# Patient Record
Sex: Female | Born: 1960 | Race: White | Hispanic: No | Marital: Married | Smoking: Never smoker
Health system: Southern US, Community
[De-identification: ages and names within clinical notes are randomized; demographics above are authoritative.]

## PROBLEM LIST (undated history)

## (undated) DIAGNOSIS — IMO0002 Reserved for concepts with insufficient information to code with codable children: Secondary | ICD-10-CM

## (undated) DIAGNOSIS — S2239XA Fracture of one rib, unspecified side, initial encounter for closed fracture: Secondary | ICD-10-CM

## (undated) HISTORY — DX: Fracture of one rib, unspecified side, initial encounter for closed fracture: S22.39XA

## (undated) HISTORY — PX: APPENDECTOMY: SHX54

## (undated) HISTORY — PX: KNEE SURGERY: SHX244

---

## 2007-04-02 ENCOUNTER — Emergency Department (HOSPITAL_COMMUNITY): Admission: EM | Admit: 2007-04-02 | Discharge: 2007-04-02 | Payer: Self-pay | Admitting: Emergency Medicine

## 2008-05-30 ENCOUNTER — Emergency Department (HOSPITAL_BASED_OUTPATIENT_CLINIC_OR_DEPARTMENT_OTHER): Admission: EM | Admit: 2008-05-30 | Discharge: 2008-05-30 | Payer: Self-pay | Admitting: Emergency Medicine

## 2008-07-13 ENCOUNTER — Emergency Department (HOSPITAL_BASED_OUTPATIENT_CLINIC_OR_DEPARTMENT_OTHER): Admission: EM | Admit: 2008-07-13 | Discharge: 2008-07-13 | Payer: Self-pay | Admitting: Emergency Medicine

## 2008-07-21 ENCOUNTER — Emergency Department (HOSPITAL_BASED_OUTPATIENT_CLINIC_OR_DEPARTMENT_OTHER): Admission: EM | Admit: 2008-07-21 | Discharge: 2008-07-21 | Payer: Self-pay | Admitting: Emergency Medicine

## 2008-07-23 ENCOUNTER — Emergency Department (HOSPITAL_BASED_OUTPATIENT_CLINIC_OR_DEPARTMENT_OTHER): Admission: EM | Admit: 2008-07-23 | Discharge: 2008-07-23 | Payer: Self-pay | Admitting: Emergency Medicine

## 2008-09-11 ENCOUNTER — Emergency Department (HOSPITAL_BASED_OUTPATIENT_CLINIC_OR_DEPARTMENT_OTHER): Admission: EM | Admit: 2008-09-11 | Discharge: 2008-09-11 | Payer: Self-pay | Admitting: Emergency Medicine

## 2008-10-13 ENCOUNTER — Emergency Department (HOSPITAL_BASED_OUTPATIENT_CLINIC_OR_DEPARTMENT_OTHER): Admission: EM | Admit: 2008-10-13 | Discharge: 2008-10-13 | Payer: Self-pay | Admitting: Emergency Medicine

## 2008-11-25 ENCOUNTER — Emergency Department (HOSPITAL_BASED_OUTPATIENT_CLINIC_OR_DEPARTMENT_OTHER): Admission: EM | Admit: 2008-11-25 | Discharge: 2008-11-26 | Payer: Self-pay | Admitting: Emergency Medicine

## 2008-12-11 ENCOUNTER — Emergency Department (HOSPITAL_BASED_OUTPATIENT_CLINIC_OR_DEPARTMENT_OTHER): Admission: EM | Admit: 2008-12-11 | Discharge: 2008-12-11 | Payer: Self-pay | Admitting: Emergency Medicine

## 2008-12-21 ENCOUNTER — Emergency Department (HOSPITAL_BASED_OUTPATIENT_CLINIC_OR_DEPARTMENT_OTHER): Admission: EM | Admit: 2008-12-21 | Discharge: 2008-12-21 | Payer: Self-pay | Admitting: Emergency Medicine

## 2009-02-18 ENCOUNTER — Emergency Department (HOSPITAL_BASED_OUTPATIENT_CLINIC_OR_DEPARTMENT_OTHER): Admission: EM | Admit: 2009-02-18 | Discharge: 2009-02-19 | Payer: Self-pay | Admitting: Emergency Medicine

## 2009-02-19 ENCOUNTER — Emergency Department (HOSPITAL_BASED_OUTPATIENT_CLINIC_OR_DEPARTMENT_OTHER): Admission: EM | Admit: 2009-02-19 | Discharge: 2009-02-19 | Payer: Self-pay | Admitting: Emergency Medicine

## 2009-04-21 ENCOUNTER — Emergency Department (HOSPITAL_BASED_OUTPATIENT_CLINIC_OR_DEPARTMENT_OTHER): Admission: EM | Admit: 2009-04-21 | Discharge: 2009-04-21 | Payer: Self-pay | Admitting: Emergency Medicine

## 2009-04-22 ENCOUNTER — Emergency Department (HOSPITAL_BASED_OUTPATIENT_CLINIC_OR_DEPARTMENT_OTHER): Admission: EM | Admit: 2009-04-22 | Discharge: 2009-04-22 | Payer: Self-pay | Admitting: Emergency Medicine

## 2009-07-30 ENCOUNTER — Emergency Department (HOSPITAL_BASED_OUTPATIENT_CLINIC_OR_DEPARTMENT_OTHER): Admission: EM | Admit: 2009-07-30 | Discharge: 2009-07-30 | Payer: Self-pay | Admitting: Emergency Medicine

## 2009-07-30 ENCOUNTER — Ambulatory Visit: Payer: Self-pay | Admitting: Diagnostic Radiology

## 2009-08-23 ENCOUNTER — Emergency Department (HOSPITAL_BASED_OUTPATIENT_CLINIC_OR_DEPARTMENT_OTHER): Admission: EM | Admit: 2009-08-23 | Discharge: 2009-08-23 | Payer: Self-pay | Admitting: Emergency Medicine

## 2009-10-25 ENCOUNTER — Emergency Department (HOSPITAL_BASED_OUTPATIENT_CLINIC_OR_DEPARTMENT_OTHER): Admission: EM | Admit: 2009-10-25 | Discharge: 2009-10-25 | Payer: Self-pay | Admitting: Emergency Medicine

## 2009-11-03 ENCOUNTER — Emergency Department (HOSPITAL_BASED_OUTPATIENT_CLINIC_OR_DEPARTMENT_OTHER): Admission: EM | Admit: 2009-11-03 | Discharge: 2009-11-03 | Payer: Self-pay | Admitting: Emergency Medicine

## 2009-11-04 ENCOUNTER — Emergency Department (HOSPITAL_BASED_OUTPATIENT_CLINIC_OR_DEPARTMENT_OTHER): Admission: EM | Admit: 2009-11-04 | Discharge: 2009-11-04 | Payer: Self-pay | Admitting: Emergency Medicine

## 2009-11-27 ENCOUNTER — Emergency Department (HOSPITAL_BASED_OUTPATIENT_CLINIC_OR_DEPARTMENT_OTHER): Admission: EM | Admit: 2009-11-27 | Discharge: 2009-11-27 | Payer: Self-pay | Admitting: Emergency Medicine

## 2009-12-04 ENCOUNTER — Emergency Department (HOSPITAL_BASED_OUTPATIENT_CLINIC_OR_DEPARTMENT_OTHER): Admission: EM | Admit: 2009-12-04 | Discharge: 2009-12-04 | Payer: Self-pay | Admitting: Emergency Medicine

## 2009-12-12 ENCOUNTER — Emergency Department (HOSPITAL_BASED_OUTPATIENT_CLINIC_OR_DEPARTMENT_OTHER): Admission: EM | Admit: 2009-12-12 | Discharge: 2009-12-12 | Payer: Self-pay | Admitting: Emergency Medicine

## 2009-12-17 DIAGNOSIS — S2249XA Multiple fractures of ribs, unspecified side, initial encounter for closed fracture: Secondary | ICD-10-CM

## 2009-12-17 HISTORY — DX: Multiple fractures of ribs, unspecified side, initial encounter for closed fracture: S22.49XA

## 2010-02-17 ENCOUNTER — Emergency Department (HOSPITAL_BASED_OUTPATIENT_CLINIC_OR_DEPARTMENT_OTHER): Admission: EM | Admit: 2010-02-17 | Discharge: 2010-02-17 | Payer: Self-pay | Admitting: Emergency Medicine

## 2010-05-13 ENCOUNTER — Emergency Department (HOSPITAL_BASED_OUTPATIENT_CLINIC_OR_DEPARTMENT_OTHER): Admission: EM | Admit: 2010-05-13 | Discharge: 2010-05-13 | Payer: Self-pay | Admitting: Emergency Medicine

## 2010-06-30 ENCOUNTER — Emergency Department (HOSPITAL_BASED_OUTPATIENT_CLINIC_OR_DEPARTMENT_OTHER): Admission: EM | Admit: 2010-06-30 | Discharge: 2010-06-30 | Payer: Self-pay | Admitting: Emergency Medicine

## 2010-07-16 ENCOUNTER — Emergency Department (HOSPITAL_BASED_OUTPATIENT_CLINIC_OR_DEPARTMENT_OTHER): Admission: EM | Admit: 2010-07-16 | Discharge: 2010-07-16 | Payer: Self-pay | Admitting: Emergency Medicine

## 2010-08-01 ENCOUNTER — Emergency Department (HOSPITAL_BASED_OUTPATIENT_CLINIC_OR_DEPARTMENT_OTHER): Admission: EM | Admit: 2010-08-01 | Discharge: 2010-08-01 | Payer: Self-pay | Admitting: Emergency Medicine

## 2010-08-31 ENCOUNTER — Emergency Department (HOSPITAL_BASED_OUTPATIENT_CLINIC_OR_DEPARTMENT_OTHER): Admission: EM | Admit: 2010-08-31 | Discharge: 2010-08-31 | Payer: Self-pay | Admitting: Emergency Medicine

## 2010-10-03 ENCOUNTER — Emergency Department (HOSPITAL_BASED_OUTPATIENT_CLINIC_OR_DEPARTMENT_OTHER): Admission: EM | Admit: 2010-10-03 | Discharge: 2010-10-03 | Payer: Self-pay | Admitting: Emergency Medicine

## 2010-11-15 ENCOUNTER — Emergency Department (HOSPITAL_BASED_OUTPATIENT_CLINIC_OR_DEPARTMENT_OTHER)
Admission: EM | Admit: 2010-11-15 | Discharge: 2010-11-16 | Payer: Self-pay | Source: Home / Self Care | Admitting: Emergency Medicine

## 2010-11-15 ENCOUNTER — Emergency Department (HOSPITAL_BASED_OUTPATIENT_CLINIC_OR_DEPARTMENT_OTHER)
Admission: EM | Admit: 2010-11-15 | Discharge: 2010-11-15 | Payer: Self-pay | Source: Home / Self Care | Admitting: Emergency Medicine

## 2010-12-05 ENCOUNTER — Emergency Department (HOSPITAL_BASED_OUTPATIENT_CLINIC_OR_DEPARTMENT_OTHER)
Admission: EM | Admit: 2010-12-05 | Discharge: 2010-12-05 | Payer: Self-pay | Source: Home / Self Care | Admitting: Emergency Medicine

## 2010-12-06 ENCOUNTER — Emergency Department (HOSPITAL_BASED_OUTPATIENT_CLINIC_OR_DEPARTMENT_OTHER)
Admission: EM | Admit: 2010-12-06 | Discharge: 2010-12-06 | Payer: Self-pay | Source: Home / Self Care | Admitting: Emergency Medicine

## 2010-12-06 ENCOUNTER — Emergency Department (HOSPITAL_BASED_OUTPATIENT_CLINIC_OR_DEPARTMENT_OTHER): Admission: EM | Admit: 2010-12-06 | Payer: Self-pay | Source: Home / Self Care | Admitting: Emergency Medicine

## 2010-12-19 ENCOUNTER — Emergency Department (HOSPITAL_BASED_OUTPATIENT_CLINIC_OR_DEPARTMENT_OTHER)
Admission: EM | Admit: 2010-12-19 | Discharge: 2010-12-20 | Payer: Self-pay | Source: Home / Self Care | Admitting: Emergency Medicine

## 2010-12-27 ENCOUNTER — Emergency Department (HOSPITAL_BASED_OUTPATIENT_CLINIC_OR_DEPARTMENT_OTHER)
Admission: EM | Admit: 2010-12-27 | Discharge: 2010-12-27 | Payer: Self-pay | Source: Home / Self Care | Admitting: Emergency Medicine

## 2011-01-09 ENCOUNTER — Emergency Department (HOSPITAL_BASED_OUTPATIENT_CLINIC_OR_DEPARTMENT_OTHER)
Admission: EM | Admit: 2011-01-09 | Discharge: 2011-01-10 | Payer: Self-pay | Source: Home / Self Care | Admitting: Emergency Medicine

## 2011-01-25 ENCOUNTER — Emergency Department (HOSPITAL_BASED_OUTPATIENT_CLINIC_OR_DEPARTMENT_OTHER)
Admission: EM | Admit: 2011-01-25 | Discharge: 2011-01-25 | Disposition: A | Discharge status: Home / Self Care | Payer: PRIVATE HEALTH INSURANCE | Attending: Emergency Medicine | Admitting: Emergency Medicine

## 2011-01-25 DIAGNOSIS — R51 Headache: Secondary | ICD-10-CM | POA: Insufficient documentation

## 2011-01-26 ENCOUNTER — Emergency Department (HOSPITAL_BASED_OUTPATIENT_CLINIC_OR_DEPARTMENT_OTHER)
Admission: EM | Admit: 2011-01-26 | Discharge: 2011-01-26 | Disposition: A | Discharge status: Home / Self Care | Payer: PRIVATE HEALTH INSURANCE | Attending: Emergency Medicine | Admitting: Emergency Medicine

## 2011-01-26 DIAGNOSIS — G43909 Migraine, unspecified, not intractable, without status migrainosus: Secondary | ICD-10-CM | POA: Insufficient documentation

## 2011-01-26 DIAGNOSIS — Z79899 Other long term (current) drug therapy: Secondary | ICD-10-CM | POA: Insufficient documentation

## 2011-02-09 ENCOUNTER — Emergency Department (HOSPITAL_BASED_OUTPATIENT_CLINIC_OR_DEPARTMENT_OTHER)
Admission: EM | Admit: 2011-02-09 | Discharge: 2011-02-09 | Disposition: A | Discharge status: Home / Self Care | Payer: PRIVATE HEALTH INSURANCE | Attending: Emergency Medicine | Admitting: Emergency Medicine

## 2011-02-09 DIAGNOSIS — Z79899 Other long term (current) drug therapy: Secondary | ICD-10-CM | POA: Insufficient documentation

## 2011-02-09 DIAGNOSIS — R110 Nausea: Secondary | ICD-10-CM | POA: Insufficient documentation

## 2011-02-09 DIAGNOSIS — G43909 Migraine, unspecified, not intractable, without status migrainosus: Secondary | ICD-10-CM | POA: Insufficient documentation

## 2011-02-09 DIAGNOSIS — H53149 Visual discomfort, unspecified: Secondary | ICD-10-CM | POA: Insufficient documentation

## 2011-03-23 LAB — B. BURGDORFI ANTIBODIES: B burgdorferi Ab IgG+IgM: 0.36 {ISR}

## 2011-08-15 ENCOUNTER — Encounter: Payer: Self-pay | Admitting: *Deleted

## 2011-08-15 ENCOUNTER — Emergency Department (HOSPITAL_BASED_OUTPATIENT_CLINIC_OR_DEPARTMENT_OTHER)
Admission: EM | Admit: 2011-08-15 | Discharge: 2011-08-15 | Disposition: A | Discharge status: Home / Self Care | Payer: PRIVATE HEALTH INSURANCE | Attending: Emergency Medicine | Admitting: Emergency Medicine

## 2011-08-15 DIAGNOSIS — R51 Headache: Secondary | ICD-10-CM | POA: Insufficient documentation

## 2011-08-15 HISTORY — DX: Reserved for concepts with insufficient information to code with codable children: IMO0002

## 2011-08-15 MED ORDER — HYDROMORPHONE HCL 1 MG/ML IJ SOLN
1.0000 mg | Freq: Once | INTRAMUSCULAR | Status: AC
  Administered 2011-08-15: 1 mg via INTRAVENOUS

## 2011-08-15 MED ORDER — LORAZEPAM 2 MG/ML IJ SOLN
1.0000 mg | Freq: Once | INTRAMUSCULAR | Status: AC
  Administered 2011-08-15: 1 mg via INTRAVENOUS
  Filled 2011-08-15: qty 1

## 2011-08-15 MED ORDER — METOCLOPRAMIDE HCL 5 MG/ML IJ SOLN
10.0000 mg | Freq: Once | INTRAMUSCULAR | Status: AC
  Administered 2011-08-15: 10 mg via INTRAVENOUS
  Filled 2011-08-15: qty 2

## 2011-08-15 MED ORDER — METHYLPREDNISOLONE SODIUM SUCC 125 MG IJ SOLR
125.0000 mg | Freq: Once | INTRAMUSCULAR | Status: AC
Start: 2011-08-15 — End: 2011-08-15
  Administered 2011-08-15: 125 mg via INTRAVENOUS
  Filled 2011-08-15: qty 2

## 2011-08-15 MED ORDER — DIPHENHYDRAMINE HCL 50 MG/ML IJ SOLN
25.0000 mg | Freq: Once | INTRAMUSCULAR | Status: AC
  Administered 2011-08-15: 25 mg via INTRAVENOUS
  Filled 2011-08-15: qty 1

## 2011-08-15 MED ORDER — ONDANSETRON HCL 4 MG/2ML IJ SOLN
4.0000 mg | Freq: Once | INTRAMUSCULAR | Status: AC
  Administered 2011-08-15: 4 mg via INTRAVENOUS
  Filled 2011-08-15: qty 2

## 2011-08-15 MED ORDER — SODIUM CHLORIDE 0.9 % IV BOLUS (SEPSIS)
1000.0000 mL | Freq: Once | INTRAVENOUS | Status: AC
  Administered 2011-08-15: 1000 mL via INTRAVENOUS

## 2011-08-15 MED ORDER — HYDROMORPHONE HCL 1 MG/ML IJ SOLN
INTRAMUSCULAR | Status: AC
  Administered 2011-08-15: 1 mg via INTRAVENOUS
  Filled 2011-08-15: qty 1

## 2011-08-15 MED ORDER — KETOROLAC TROMETHAMINE 30 MG/ML IJ SOLN
30.0000 mg | Freq: Once | INTRAMUSCULAR | Status: AC
  Administered 2011-08-15: 30 mg via INTRAVENOUS
  Filled 2011-08-15: qty 1

## 2011-08-15 NOTE — ED Notes (Signed)
Pt c/o migraine x 2 days.  ?

## 2011-08-15 NOTE — ED Notes (Signed)
Patient is resting comfortably. 

## 2011-08-15 NOTE — ED Provider Notes (Signed)
History     CSN: [id number] Arrival date & time: 08/15/2011  2:16 PM  Chief Complaint  Patient presents with  . Migraine   Patient is a 50 y.o. female presenting with migraine. The history is provided by the patient. No language interpreter was used.  Migraine This is a new problem. The current episode started yesterday. The problem occurs constantly. The problem has been gradually worsening. Associated symptoms include headaches, myalgias, nausea and vomiting. Pertinent negatives include no fatigue, fever, rash, sore throat, swollen glands, vertigo, visual change or weakness. The symptoms are aggravated by nothing. She has tried rest for the symptoms. The treatment provided no relief.  Pt complains of a migrane headache.  Pt reports no relief with usual medications.  Pt reports she has been vomitting today.  Past Medical History  Diagnosis Date  . Migraine   . Spinal cord injuries     Past Surgical History  Procedure Date  . Joint replacement   . Appendectomy     History reviewed. No pertinent family history.  History  Substance Use Topics  . Smoking status: Never Smoker   . Smokeless tobacco: Not on file  . Alcohol Use: No    OB History    Grav Para Term Preterm Abortions TAB SAB Ect Mult Living                  Review of Systems  Constitutional: Negative for fever and fatigue.  HENT: Negative for sore throat.   Gastrointestinal: Positive for nausea and vomiting.  Musculoskeletal: Positive for myalgias.  Skin: Negative for rash.  Neurological: Positive for headaches. Negative for vertigo and weakness.  All other systems reviewed and are negative.    Physical Exam  BP 112/68  Pulse 102  Temp 97.6 F (36.4 C)  Resp 18  Wt 120 lb (54.432 kg)  Physical Exam  Nursing note and vitals reviewed. Constitutional: She is oriented to person, place, and time. She appears well-developed and well-nourished.  HENT:  Head: Normocephalic and atraumatic.  Eyes:  Conjunctivae and EOM are normal. Pupils are equal, round, and reactive to light.  Neck: Normal range of motion. Neck supple.  Cardiovascular: Normal rate.   Pulmonary/Chest: Effort normal and breath sounds normal.  Abdominal: Soft. Bowel sounds are normal.  Musculoskeletal: Normal range of motion.  Neurological: She is alert and oriented to person, place, and time. She has normal reflexes.  Skin: Skin is warm and dry.  Psychiatric: She has a normal mood and affect.    ED Course  Procedures  MDM Pt given migrane cocktail,  Pt complains of continued vomitting,  Pt given Iv fluids x 1 liter and ativan and zofran.      Langston Masker, [place] 08/15/11 1913  Langston Masker, [place] 08/15/11 [phone]

## 2011-08-15 NOTE — ED Notes (Signed)
Family at bedside.reports pain 4/10

## 2011-08-15 NOTE — ED Provider Notes (Signed)
Medical screening examination/treatment/procedure(s) were performed by non-physician practitioner and as supervising physician I was immediately available for consultation/collaboration.   Maddelynn Moosman, MD 08/15/11 2353 

## 2011-08-15 NOTE — ED Notes (Signed)
Reports headache is 8/10

## 2011-08-15 NOTE — ED Notes (Signed)
Pt acuity changed from 4 to 3 based on treatment plan.  

## 2012-01-19 ENCOUNTER — Other Ambulatory Visit: Payer: Self-pay

## 2012-01-19 ENCOUNTER — Emergency Department (INDEPENDENT_AMBULATORY_CARE_PROVIDER_SITE_OTHER): Payer: PRIVATE HEALTH INSURANCE

## 2012-01-19 ENCOUNTER — Encounter (HOSPITAL_BASED_OUTPATIENT_CLINIC_OR_DEPARTMENT_OTHER): Payer: Self-pay | Admitting: Emergency Medicine

## 2012-01-19 ENCOUNTER — Emergency Department (HOSPITAL_BASED_OUTPATIENT_CLINIC_OR_DEPARTMENT_OTHER)
Admission: EM | Admit: 2012-01-19 | Discharge: 2012-01-19 | Disposition: A | Discharge status: Home / Self Care | Payer: PRIVATE HEALTH INSURANCE | Attending: Emergency Medicine | Admitting: Emergency Medicine

## 2012-01-19 DIAGNOSIS — S20219A Contusion of unspecified front wall of thorax, initial encounter: Secondary | ICD-10-CM | POA: Insufficient documentation

## 2012-01-19 DIAGNOSIS — W19XXXA Unspecified fall, initial encounter: Secondary | ICD-10-CM

## 2012-01-19 DIAGNOSIS — R079 Chest pain, unspecified: Secondary | ICD-10-CM

## 2012-01-19 DIAGNOSIS — S298XXA Other specified injuries of thorax, initial encounter: Secondary | ICD-10-CM

## 2012-01-19 DIAGNOSIS — M899 Disorder of bone, unspecified: Secondary | ICD-10-CM

## 2012-01-19 DIAGNOSIS — T07XXXA Unspecified multiple injuries, initial encounter: Secondary | ICD-10-CM | POA: Diagnosis not present

## 2012-01-19 DIAGNOSIS — S60219A Contusion of unspecified wrist, initial encounter: Secondary | ICD-10-CM | POA: Insufficient documentation

## 2012-01-19 DIAGNOSIS — S6990XA Unspecified injury of unspecified wrist, hand and finger(s), initial encounter: Secondary | ICD-10-CM | POA: Diagnosis not present

## 2012-01-19 DIAGNOSIS — M25539 Pain in unspecified wrist: Secondary | ICD-10-CM

## 2012-01-19 DIAGNOSIS — S59919A Unspecified injury of unspecified forearm, initial encounter: Secondary | ICD-10-CM

## 2012-01-19 DIAGNOSIS — S59909A Unspecified injury of unspecified elbow, initial encounter: Secondary | ICD-10-CM | POA: Diagnosis not present

## 2012-01-19 DIAGNOSIS — W108XXA Fall (on) (from) other stairs and steps, initial encounter: Secondary | ICD-10-CM | POA: Insufficient documentation

## 2012-01-19 MED ORDER — DIAZEPAM 5 MG PO TABS: 5.0000 mg | ORAL_TABLET | Freq: Every day | ORAL | Status: AC

## 2012-01-19 MED ORDER — IBUPROFEN 800 MG PO TABS: 800.0000 mg | ORAL_TABLET | Freq: Three times a day (TID) | ORAL | Status: DC

## 2012-01-19 NOTE — ED Notes (Signed)
rx x 2 given for valium and ibuprofen- ride present

## 2012-01-19 NOTE — ED Notes (Signed)
Pt reports she fell down 3 stairs one hour ago- c/o left side chest pain worse with inspiration- hx of broken ribs on same side

## 2012-01-19 NOTE — ED Notes (Signed)
Patient transported to X-ray 

## 2012-01-19 NOTE — ED Provider Notes (Signed)
History     CSN: [id number]  Arrival date & time 01/19/12  1719   First MD Initiated Contact with Patient 01/19/12 1730      Chief Complaint  Patient presents with  . Chest Pain    (Consider location/radiation/quality/duration/timing/severity/associated sxs/prior treatment) HPI  Past Medical History  Diagnosis Date  . Migraine   . Spinal cord injuries     Past Surgical History  Procedure Date  . Appendectomy   . Knee surgery     No family history on file.  History  Substance Use Topics  . Smoking status: Never Smoker   . Smokeless tobacco: Never Used  . Alcohol Use: No    OB History    Grav Para Term Preterm Abortions TAB SAB Ect Mult Living                  Review of Systems  Allergies  Codeine and Tetanus toxoids  Home Medications   Current Outpatient Rx  Name Route Sig Dispense Refill  . DICLOFENAC POTASSIUM 50 MG PO PACK Oral Take 50 mg by mouth. Once on the onset of headache/migraine    . SUMATRIPTAN SUCCINATE 6 MG/0.5ML [place] SOLN Subcutaneous Inject 6 mg into the skin every 2 (two) hours as needed. For migraine    . TOPIRAMATE 100 MG PO TABS Oral Take 200 mg by mouth 2 (two) times daily.        BP 124/53  Pulse 95  Temp(Src) 97.4 F (36.3 C) (Oral)  Resp 12  SpO2 100%  Physical Exam  ED Course  Procedures (including critical care time)  Labs Reviewed - No data to display Dg Ribs Unilateral W/chest Left  01/19/2012  *RADIOLOGY REPORT*  Clinical Data: Fall.  Left chest injury and rib pain.  LEFT RIBS AND CHEST - 3+ VIEW  Comparison: 12/27/2010  Findings: No evidence of acute left-sided rib fractures.  Old fracture deformities are seen involving the left anterior seventh, eighth, and ninth ribs.  No evidence of hemothorax or pneumothorax.  Both lungs are clear. Heart size is normal.  No evidence of mediastinal widening or tracheal deviation.  Incidental note is made of bilateral nipple shadows on the PA projection.  IMPRESSION: No acute findings.   Original Report Authenticated By: Danae Orleans, M.D.   Dg Wrist Complete Left  01/19/2012  *RADIOLOGY REPORT*  Clinical Data: Fall.  Wrist injury and pain.  LEFT WRIST - COMPLETE 3+ VIEW  Comparison: None.  Findings: Generalized osteopenia is noted.  No evidence of fracture or dislocation.  No evidence of arthropathy or other bone lesions. Soft tissues are unremarkable.  IMPRESSION:  1.  No acute findings. 2.  Osteopenia.  Original Report Authenticated By: Danae Orleans, M.D.     No diagnosis found.  XR all reviewed by me  MDM  This generally well female presents following a fall down 3 steps just prior to arrival.  On exam she is in no distress though she has appreciable discomfort in her left chest and left wrist.  Radiographic studies are negative.  The patient we discharged with additional pain medications in stable condition.        Gerhard Munch, MD 01/19/12 [phone]

## 2012-01-19 NOTE — ED Provider Notes (Signed)
History   This chart was scribed for Gerhard Munch, MD by Melba Coon. The patient was seen in room MH04/MH04 and the patient's care was started at 5:45PM.    CSN: [id number]  Arrival date & time 01/19/12  1719   First MD Initiated Contact with Patient 01/19/12 1730      Chief Complaint  Patient presents with  . Chest Pain    (Consider location/radiation/quality/duration/timing/severity/associated sxs/prior treatment) HPI Tasha Cruz is a 51 y.o. female who presents to the Emergency Department complaining of constant, diffuse, sharp, moderate to severe left-sided chest pain pertaining to a fall down 3 stairs onto a cement surface with an onset about an hour and a half ago. Pt also has left shoulder, left wrist, and right knee pain. Pt fell but thought her injuries were not serious. About 10 min later, pt was was sitting down when she noticed increased pressure in her chest while breathing. Pt rates the severity of the  CP 6/10. Pt states that 2.5 years ago, she punctured her left lung and fractured ribs in the same area as the present chest pain. Nausea present. No LOC, vomit, or diarrhea. Pt has a Hx of migraines in which she takes Topamax 2x day, Amtrex shots as needed, and Botox every 3 months. Pt is allergic to OxyContin, codeine, and tetanus toxoids.   Past Medical History  Diagnosis Date  . Migraine   . Spinal cord injuries     Past Surgical History  Procedure Date  . Appendectomy   . Knee surgery     No family history on file.  History  Substance Use Topics  . Smoking status: Never Smoker   . Smokeless tobacco: Never Used  . Alcohol Use: No    OB History    Grav Para Term Preterm Abortions TAB SAB Ect Mult Living                  Review of Systems 10 Systems reviewed and are negative for acute change except as noted in the HPI.  Allergies  Codeine and Tetanus toxoids  Home Medications   Current Outpatient Rx  Name Route Sig Dispense Refill  .  DICLOFENAC POTASSIUM 50 MG PO PACK Oral Take 50 mg by mouth. Once on the onset of headache/migraine    . SUMATRIPTAN SUCCINATE 6 MG/0.5ML [place] SOLN Subcutaneous Inject 6 mg into the skin every 2 (two) hours as needed. For migraine    . TOPIRAMATE 100 MG PO TABS Oral Take 200 mg by mouth 2 (two) times daily.        There were no vitals taken for this visit.  Physical Exam  Constitutional: She is oriented to person, place, and time. She appears well-developed and well-nourished.  HENT:  Head: Normocephalic and atraumatic.  Eyes: Conjunctivae and EOM are normal. Pupils are equal, round, and reactive to light. No scleral icterus.  Neck: Normal range of motion. Neck supple. No thyromegaly present.  Cardiovascular: Normal rate, regular rhythm and normal heart sounds.  Exam reveals no gallop and no friction rub.   No murmur heard. Pulmonary/Chest: Effort normal and breath sounds normal. No stridor. She has no wheezes. She has no rales. She exhibits no tenderness.  Abdominal: Soft. Bowel sounds are normal. She exhibits no distension. There is no tenderness. There is no rebound.  Musculoskeletal: Normal range of motion. She exhibits tenderness (5th metacarpal proximally). She exhibits no edema.       no true wrist deformity  Lymphadenopathy:  She has no cervical adenopathy.  Neurological: She is alert and oriented to person, place, and time. Coordination normal.  Skin: Skin is warm. Abrasion (right knee) noted. No rash noted. No erythema.  Psychiatric: She has a normal mood and affect. Her behavior is normal.    ED Course  Procedures (including critical care time)  DIAGNOSTIC STUDIES: Oxygen Saturation is 100% on room air, normal by my interpretation.    COORDINATION OF CARE:     Labs Reviewed - No data to display No results found.   No diagnosis found.    MDM  Please see the duplicate chart for the remainder of this visit information.      Gerhard Munch, MD 02/05/12  [phone]

## 2012-01-19 NOTE — ED Notes (Signed)
[place] MD at bedside. 

## 2012-01-29 ENCOUNTER — Encounter: Payer: Self-pay | Admitting: Family

## 2012-01-29 ENCOUNTER — Ambulatory Visit (HOSPITAL_BASED_OUTPATIENT_CLINIC_OR_DEPARTMENT_OTHER)
Admission: RE | Admit: 2012-01-29 | Discharge: 2012-01-29 | Disposition: A | Discharge status: Home / Self Care | Payer: PRIVATE HEALTH INSURANCE | Source: Ambulatory Visit | Attending: Family | Admitting: Family

## 2012-01-29 ENCOUNTER — Telehealth: Payer: Self-pay | Admitting: Family

## 2012-01-29 ENCOUNTER — Ambulatory Visit (INDEPENDENT_AMBULATORY_CARE_PROVIDER_SITE_OTHER): Payer: PRIVATE HEALTH INSURANCE | Admitting: Family

## 2012-01-29 DIAGNOSIS — R079 Chest pain, unspecified: Secondary | ICD-10-CM

## 2012-01-29 DIAGNOSIS — W19XXXA Unspecified fall, initial encounter: Secondary | ICD-10-CM | POA: Diagnosis not present

## 2012-01-29 DIAGNOSIS — S20219A Contusion of unspecified front wall of thorax, initial encounter: Secondary | ICD-10-CM

## 2012-01-29 DIAGNOSIS — G43909 Migraine, unspecified, not intractable, without status migrainosus: Secondary | ICD-10-CM | POA: Diagnosis not present
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
## 2012-01-29 NOTE — Telephone Encounter (Signed)
Please let pt know that her CT of her chest is normal.  No rib fractures or lung abnormalities are noted.  I think that her pain is coming from severe bruising from her fall.  I recommend that she use tylenol for pain.  Pain should continue to improve over the next few weeks.

## 2012-01-29 NOTE — Assessment & Plan Note (Signed)
Will obtain CT chest to further evaluate ribs, lungs, heart.  Continue prn cambia.

## 2012-01-29 NOTE — Telephone Encounter (Signed)
Notified pt. 

## 2012-01-29 NOTE — Patient Instructions (Signed)
Please complete your CT on the first floor- we will call you with your results. Follow up at your convenience for a fasting physical.

## 2012-01-30 DIAGNOSIS — G43719 Chronic migraine without aura, intractable, without status migrainosus: Secondary | ICD-10-CM | POA: Insufficient documentation

## 2012-01-30 NOTE — Assessment & Plan Note (Signed)
She is following with neurology and reports good success with the use of botox injections.

## 2012-02-25 ENCOUNTER — Emergency Department (HOSPITAL_BASED_OUTPATIENT_CLINIC_OR_DEPARTMENT_OTHER)
Admission: EM | Admit: 2012-02-25 | Discharge: 2012-02-25 | Disposition: A | Discharge status: Home / Self Care | Payer: PRIVATE HEALTH INSURANCE | Attending: Emergency Medicine | Admitting: Emergency Medicine

## 2012-02-25 ENCOUNTER — Encounter (HOSPITAL_BASED_OUTPATIENT_CLINIC_OR_DEPARTMENT_OTHER): Payer: Self-pay | Admitting: Family Medicine

## 2012-02-25 DIAGNOSIS — G43909 Migraine, unspecified, not intractable, without status migrainosus: Secondary | ICD-10-CM

## 2012-02-25 DIAGNOSIS — R51 Headache: Secondary | ICD-10-CM | POA: Diagnosis not present

## 2012-02-25 MED ORDER — ONDANSETRON HCL 4 MG/2ML IJ SOLN
4.0000 mg | Freq: Once | INTRAMUSCULAR | Status: AC
  Administered 2012-02-25: 4 mg via INTRAVENOUS
  Filled 2012-02-25: qty 2

## 2012-02-25 MED ORDER — DEXAMETHASONE SODIUM PHOSPHATE 10 MG/ML IJ SOLN
10.0000 mg | Freq: Once | INTRAMUSCULAR | Status: AC
  Administered 2012-02-25: 10 mg via INTRAVENOUS
  Filled 2012-02-25 (×2): qty 1

## 2012-02-25 MED ORDER — HYDROMORPHONE HCL PF 1 MG/ML IJ SOLN
1.0000 mg | Freq: Once | INTRAMUSCULAR | Status: AC
  Administered 2012-02-25: 1 mg via INTRAVENOUS
  Filled 2012-02-25: qty 1

## 2012-02-25 MED ORDER — SODIUM CHLORIDE 0.9 % IV BOLUS (SEPSIS)
1000.0000 mL | Freq: Once | INTRAVENOUS | Status: AC
  Administered 2012-02-25: 1000 mL via INTRAVENOUS

## 2012-02-25 MED ORDER — SODIUM CHLORIDE 0.9 % IV SOLN
INTRAVENOUS | Status: DC
  Administered 2012-02-25: 09:00:00 via INTRAVENOUS

## 2012-02-25 MED ORDER — VALPROATE SODIUM 500 MG/5ML IV SOLN
500.0000 mg | Freq: Once | INTRAVENOUS | Status: AC
  Administered 2012-02-25: 500 mg via INTRAVENOUS
  Filled 2012-02-25: qty 5

## 2012-02-25 MED ORDER — PROMETHAZINE HCL 25 MG/ML IJ SOLN
12.5000 mg | Freq: Once | INTRAMUSCULAR | Status: AC
  Administered 2012-02-25: 12.5 mg via INTRAVENOUS
  Filled 2012-02-25: qty 1

## 2012-02-25 MED ORDER — DIPHENHYDRAMINE HCL 50 MG/ML IJ SOLN
25.0000 mg | Freq: Once | INTRAMUSCULAR | Status: AC
  Administered 2012-02-25: 25 mg via INTRAVENOUS
  Filled 2012-02-25: qty 1

## 2012-02-25 NOTE — Discharge Instructions (Signed)
Migraine Headache A migraine is very bad pain on one or both sides of your head. The cause of a migraine is not always known. A migraine can be triggered or caused by different things, such as:  Alcohol.   Smoking.   Stress.   Periods (menstruation) in women.   Aged cheeses.   Foods or drinks that contain nitrates, glutamate, aspartame, or tyramine.   Lack of sleep.   Chocolate.   Caffeine.   Hunger.   Medicines, such as nitroglycerine (used to treat chest pain), birth control pills, estrogen, and some blood pressure medicines.  HOME CARE  Many medicines can help migraine pain or keep migraines from coming back. Your doctor can help you decide on a medicine or treatment program.   If you or your child gets a migraine, it may help to lie down in a dark, quiet room.   Keep a headache journal. This may help find out what is causing the headaches. For example, write down:   What you eat and drink.   How much sleep you get.   Any change to your diet or medicines.  GET HELP RIGHT AWAY IF:   The medicine does not work.   The pain begins again.   The neck is stiff.   You have trouble seeing.   The muscles are weak or you lose muscle control.   You have new symptoms.   You lose your balance.   You have trouble walking.   You feel faint or pass out.  MAKE SURE YOU:   Understand these instructions.   Will watch this condition.   Will get help right away if you are not doing well or get worse.  Document Released: 09/11/2008 Document Revised: 11/22/2011 Document Reviewed: 08/08/2009 [facility] Patient Information 2012 [place], [place].  Followup with your neurologist for further care of your migraine as scheduled. Return for new or worse symptoms.

## 2012-02-25 NOTE — ED Notes (Signed)
MD at bedside. 

## 2012-02-25 NOTE — ED Notes (Addendum)
Pt c/o migraine since Saturday with nausea and photophobia. Pt reports h/o same. Pt sts she took imitrex injection, tegretol and other meds with some relief.

## 2012-02-25 NOTE — ED Provider Notes (Signed)
History     CSN: [id number]  Arrival date & time 02/25/12  6578   First MD Initiated Contact with Patient 02/25/12 [phone]      Chief Complaint  Patient presents with  . Migraine    (Consider location/radiation/quality/duration/timing/severity/associated sxs/prior treatment) Patient is a 51 y.o. female presenting with migraine. The history is provided by the patient and the spouse.  Migraine This is a new problem. The current episode started 2 days ago. The problem occurs constantly. The problem has been gradually worsening. Associated symptoms include headaches. Pertinent negatives include no chest pain, no abdominal pain and no shortness of breath. Exacerbated by: By light. The symptoms are relieved by nothing.   Patient with onset of migraine 2 days ago typical for her migraines left-sided symptoms associated with nausea no vomiting photophobia no neuro deficits or focal findings. Pain is 10 out of 10.   Past Medical History  Diagnosis Date  . Migraine   . Spinal cord injuries   . Rib fractures 2011    Past Surgical History  Procedure Date  . Appendectomy   . Knee surgery     Family History  Problem Relation Age of Onset  . Hypertension Mother     deceased at 68  . Hepatitis Father     ? autoimmune  . Pancreatitis Sister   . Pernicious anemia Sister   . Ovarian cancer Sister     age 27    History  Substance Use Topics  . Smoking status: Never Smoker   . Smokeless tobacco: Never Used  . Alcohol Use: No    OB History    Grav Para Term Preterm Abortions TAB SAB Ect Mult Living                  Review of Systems  Constitutional: Negative for fever and chills.  HENT: Negative for congestion, neck pain and neck stiffness.   Eyes: Positive for photophobia. Negative for redness and visual disturbance.  Respiratory: Negative for cough and shortness of breath.   Cardiovascular: Negative for chest pain.  Gastrointestinal: Positive for nausea. Negative for  vomiting, abdominal pain and diarrhea.  Genitourinary: Negative for dysuria.  Musculoskeletal: Negative for back pain.  Skin: Negative for rash.  Neurological: Positive for headaches. Negative for dizziness, seizures, facial asymmetry, speech difficulty, weakness and numbness.  Hematological: Does not bruise/bleed easily.    Allergies  Codeine and Tetanus toxoids  Home Medications   Current Outpatient Rx  Name Route Sig Dispense Refill  . BOTOX 200 UNITS [place] SOLR  For migraines    . DICLOFENAC POTASSIUM 50 MG PO PACK Oral Take 50 mg by mouth. Once on the onset of headache/migraine    . SUMATRIPTAN SUCCINATE 6 MG/0.5ML [place] SOLN Subcutaneous Inject 6 mg into the skin every 2 (two) hours as needed. For migraine    . TOPIRAMATE 200 MG PO TABS Oral Take 200 mg by mouth 2 (two) times daily.      BP 108/66  Pulse 78  Temp(Src) 97.6 F (36.4 C) (Oral)  Resp 20  Ht 5\' 4"  (1.626 m)  Wt 127 lb (57.607 kg)  BMI 21.80 kg/m2  SpO2 98%  Physical Exam  Nursing note and vitals reviewed. Constitutional: She is oriented to person, place, and time. She appears well-developed and well-nourished.  HENT:  Head: Normocephalic and atraumatic.  Mouth/Throat: Oropharynx is clear and moist.  Eyes: Conjunctivae and EOM are normal. Pupils are equal, round, and reactive to light.  Neck: Normal range  of motion. Neck supple.  Cardiovascular: Normal rate, regular rhythm and normal heart sounds.   No murmur heard. Pulmonary/Chest: Effort normal and breath sounds normal.  Abdominal: Soft. Bowel sounds are normal. There is no tenderness.  Musculoskeletal: Normal range of motion.  Neurological: She is alert and oriented to person, place, and time. No cranial nerve deficit. She exhibits normal muscle tone. Coordination normal.  Skin: Skin is warm. No rash noted.    ED Course  Procedures (including critical care time)  Labs Reviewed - No data to display No results found.   1. Migraines       MDM    Migraine now improved significant he patient feeling much better okay for discharge and followup with her neurologist. Migraine typical for her headaches. She has a diagnosis of migraines.   Patient treated with migraine cocktail to include IV Decadron Benadryl and Phenergan patient initially refused to Decadron because she doesn't like the way it makes her feel a few days later but does not have any true allergy did not get relief with the Benadryl and Phenergan so eventually try with her neurologist suggested which was Depakote IV. That did not help either. " And going with the Decadron which she did take and also gave her 1 mg of Dilaudid and had significant relief over a period of an hour of headache. No fever no neck stiffness no concern for meningitis head bleed.        [name], MD 02/25/12 [phone]

## 2012-04-29 DIAGNOSIS — G43719 Chronic migraine without aura, intractable, without status migrainosus: Secondary | ICD-10-CM | POA: Diagnosis not present

## 2012-08-07 DIAGNOSIS — G43719 Chronic migraine without aura, intractable, without status migrainosus: Secondary | ICD-10-CM | POA: Diagnosis not present

## 2012-11-07 DIAGNOSIS — G43719 Chronic migraine without aura, intractable, without status migrainosus: Secondary | ICD-10-CM | POA: Diagnosis not present

## 2012-11-22 ENCOUNTER — Emergency Department (HOSPITAL_BASED_OUTPATIENT_CLINIC_OR_DEPARTMENT_OTHER)
Admission: EM | Admit: 2012-11-22 | Discharge: 2012-11-22 | Disposition: A | Discharge status: Home / Self Care | Payer: PRIVATE HEALTH INSURANCE | Attending: Emergency Medicine | Admitting: Emergency Medicine

## 2012-11-22 ENCOUNTER — Encounter (HOSPITAL_BASED_OUTPATIENT_CLINIC_OR_DEPARTMENT_OTHER): Payer: Self-pay | Admitting: *Deleted

## 2012-11-22 DIAGNOSIS — IMO0002 Reserved for concepts with insufficient information to code with codable children: Secondary | ICD-10-CM | POA: Insufficient documentation

## 2012-11-22 DIAGNOSIS — T1510XA Foreign body in conjunctival sac, unspecified eye, initial encounter: Secondary | ICD-10-CM | POA: Diagnosis not present

## 2012-11-22 DIAGNOSIS — Z8781 Personal history of (healed) traumatic fracture: Secondary | ICD-10-CM | POA: Insufficient documentation

## 2012-11-22 DIAGNOSIS — G43909 Migraine, unspecified, not intractable, without status migrainosus: Secondary | ICD-10-CM | POA: Diagnosis not present

## 2012-11-22 DIAGNOSIS — Z87828 Personal history of other (healed) physical injury and trauma: Secondary | ICD-10-CM | POA: Diagnosis not present

## 2012-11-22 DIAGNOSIS — Y929 Unspecified place or not applicable: Secondary | ICD-10-CM | POA: Insufficient documentation

## 2012-11-22 DIAGNOSIS — T1590XA Foreign body on external eye, part unspecified, unspecified eye, initial encounter: Secondary | ICD-10-CM

## 2012-11-22 DIAGNOSIS — Y9389 Activity, other specified: Secondary | ICD-10-CM | POA: Insufficient documentation

## 2012-11-22 NOTE — ED Notes (Signed)
Pt states she was trying some new contacts and one got misplaced in her right eye. Contact removed at triage. Pt instructed to stay for evaluation of eye and vision.

## 2012-11-22 NOTE — ED Provider Notes (Signed)
History     CSN: [id number]  Arrival date & time 11/22/12  4098   First MD Initiated Contact with Patient 11/22/12 2146      Chief Complaint  Patient presents with  . Foreign Body in Eye    (Consider location/radiation/quality/duration/timing/severity/associated sxs/prior treatment) Patient is a 51 y.o. female presenting with eye problem. The history is provided by the patient. No language interpreter was used.  Eye Problem  This is a new problem. The current episode started 3 to 5 hours ago. The problem occurs constantly. The problem has not changed since onset.The right eye is affected.Injury mechanism: Pt reports she put in a new contact and it got stuck under her eyelid  The pain is moderate. There is no history of trauma to the eye. She wears contacts. She has tried nothing for the symptoms.   Rn was able to find contact and remove at triage Past Medical History  Diagnosis Date  . Migraine   . Spinal cord injuries   . Rib fractures 2011    Past Surgical History  Procedure Date  . Appendectomy   . Knee surgery     Family History  Problem Relation Age of Onset  . Hypertension Mother     deceased at 4  . Hepatitis Father     ? autoimmune  . Pancreatitis Sister   . Pernicious anemia Sister   . Ovarian cancer Sister     age 33    History  Substance Use Topics  . Smoking status: Never Smoker   . Smokeless tobacco: Never Used  . Alcohol Use: No    OB History    Grav Para Term Preterm Abortions TAB SAB Ect Mult Living                  Review of Systems  Eyes: Positive for pain.  All other systems reviewed and are negative.    Allergies  Codeine and Tetanus toxoids  Home Medications   Current Outpatient Rx  Name  Route  Sig  Dispense  Refill  . BOTOX 200 UNITS IJ SOLR      For migraines         . DICLOFENAC POTASSIUM 50 MG PO PACK   Oral   Take 50 mg by mouth. Once on the onset of headache/migraine         . SUMATRIPTAN SUCCINATE 6  MG/0.5ML [place] SOLN   Subcutaneous   Inject 6 mg into the skin every 2 (two) hours as needed. For migraine         . TOPIRAMATE 200 MG PO TABS   Oral   Take 200 mg by mouth 2 (two) times daily.           BP 127/63  Pulse 84  Temp 98.1 F (36.7 C) (Oral)  Resp 20  Ht 5\' 5"  (1.651 m)  Wt 120 lb (54.432 kg)  BMI 19.97 kg/m2  SpO2 100%  Physical Exam  Nursing note and vitals reviewed. Constitutional: She is oriented to person, place, and time. She appears well-developed and well-nourished.  HENT:  Head: Normocephalic.  Eyes: Conjunctivae normal and EOM are normal. Pupils are equal, round, and reactive to light.       No evidence of eye injury  Neurological: She is alert and oriented to person, place, and time.  Skin: Skin is warm.  Psychiatric: She has a normal mood and affect.    ED Course  Procedures (including critical care time)  Labs Reviewed -  No data to display No results found.   1. Foreign body, eye       MDM  Pt advised to not use contacts for 24 hours.  Pt advised to discuss new contacts with her MD        Elson Areas, [place] 11/23/12 [phone]

## 2012-11-23 NOTE — ED Provider Notes (Signed)
Medical screening examination/treatment/procedure(s) were performed by non-physician practitioner and as supervising physician I was immediately available for consultation/collaboration.  Doug Sou, MD 11/23/12 0110

## 2013-05-03 ENCOUNTER — Other Ambulatory Visit: Payer: Self-pay | Admitting: Neurology

## 2013-05-13 ENCOUNTER — Ambulatory Visit (INDEPENDENT_AMBULATORY_CARE_PROVIDER_SITE_OTHER): Payer: PRIVATE HEALTH INSURANCE | Admitting: Neurology

## 2013-05-13 ENCOUNTER — Encounter: Payer: Self-pay | Admitting: Neurology

## 2013-05-13 VITALS — Ht 64.0 in | Wt 129.0 lb

## 2013-05-13 DIAGNOSIS — G43909 Migraine, unspecified, not intractable, without status migrainosus: Secondary | ICD-10-CM

## 2013-05-13 DIAGNOSIS — G43719 Chronic migraine without aura, intractable, without status migrainosus: Secondary | ICD-10-CM | POA: Diagnosis not present

## 2013-05-13 MED ORDER — ONABOTULINUMTOXINA 100 UNITS IJ SOLR: 150.0000 [IU] | Freq: Once | INTRAMUSCULAR | Status: DC
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
## 2013-05-17 ENCOUNTER — Other Ambulatory Visit: Payer: Self-pay | Admitting: Neurology

## 2013-06-16 ENCOUNTER — Encounter: Payer: Self-pay | Admitting: Family

## 2013-06-16 ENCOUNTER — Ambulatory Visit (HOSPITAL_BASED_OUTPATIENT_CLINIC_OR_DEPARTMENT_OTHER)
Admission: RE | Admit: 2013-06-16 | Discharge: 2013-06-16 | Disposition: A | Discharge status: Home / Self Care | Payer: PRIVATE HEALTH INSURANCE | Source: Ambulatory Visit | Attending: Family | Admitting: Family

## 2013-06-16 ENCOUNTER — Ambulatory Visit (INDEPENDENT_AMBULATORY_CARE_PROVIDER_SITE_OTHER): Payer: PRIVATE HEALTH INSURANCE | Admitting: Family

## 2013-06-16 VITALS — BP 110/80 | HR 88 | Temp 97.8°F | Resp 18 | Wt 125.0 lb

## 2013-06-16 DIAGNOSIS — R059 Cough, unspecified: Secondary | ICD-10-CM | POA: Insufficient documentation

## 2013-06-16 DIAGNOSIS — R05 Cough: Secondary | ICD-10-CM | POA: Insufficient documentation

## 2013-06-16 DIAGNOSIS — R233 Spontaneous ecchymoses: Secondary | ICD-10-CM

## 2013-06-16 DIAGNOSIS — M2550 Pain in unspecified joint: Secondary | ICD-10-CM | POA: Diagnosis not present

## 2013-06-16 LAB — CBC WITH DIFFERENTIAL/PLATELET
Eosinophils Relative: 3 % (ref 0–5)
HCT: 40.1 % (ref 36.0–46.0)
Hemoglobin: 13.7 g/dL (ref 12.0–15.0)
Lymphocytes Relative: 35 % (ref 12–46)
MCV: 90.3 fL (ref 78.0–100.0)
Monocytes Absolute: 0.5 10*3/uL (ref 0.1–1.0)
Monocytes Relative: 9 % (ref 3–12)
Neutro Abs: 2.9 10*3/uL (ref 1.7–7.7)
RDW: 13 % (ref 11.5–15.5)
WBC: 5.3 10*3/uL (ref 4.0–10.5)

## 2013-06-16 LAB — SEDIMENTATION RATE: Sed Rate: 4 mm/hr (ref 0–22)

## 2013-06-16 LAB — URIC ACID: Uric Acid, Serum: 3.5 mg/dL (ref 2.4–7.0)

## 2013-06-16 NOTE — Assessment & Plan Note (Signed)
She reports that she has been tested for lyme disease several times and that she has tested negative.  Will check ANA, RA, ESR and a uric acid. Refer to rheumatology for further evaluation.

## 2013-06-16 NOTE — Assessment & Plan Note (Signed)
Will obtain CBC to evaluate platelets/H/H.

## 2013-06-16 NOTE — Patient Instructions (Addendum)
Please complete your lab work prior to leaving. You will be contacted about your referral to rheumatology.  Please let us know if you have not heard back within 1 week about your referral. Follow up in 6 weeks.

## 2013-06-16 NOTE — Assessment & Plan Note (Signed)
Obtain CXR 

## 2013-06-16 NOTE — Progress Notes (Signed)
Subjective:    Patient ID: Tasha Cruz, female    DOB: 1961-10-12, 52 y.o.   MRN: [id number]  HPI  [name] is a 52 yr old female who presents today to discuss a "broken blood vessels" under her arms.   Also reports concern re: joint pain.  Reports that her toes and feet are hurting to the point that she has had to buy new shoes and has had trouble walking at times.  Reports + joint pain- especially in the hands. Reports that she had negative work up 1 year ago (reports neg RA with ortho) and that they recommended that she have rheumatology consult. She did not follow through with her rheumatology consult.  She reports that she has been tested several times or lyme disease and reports negative testing "and I even took the pills."  Reports that she has a "funny cough" and some left anterior chest wall tenderness.  Hands hurt,   Back pain.  Reports that back pain is at baseline but additional   Dad died of crohns disease.    Review of Systems See HPI  Past Medical History  Diagnosis Date  . Migraine   . Spinal cord injuries   . Rib fractures 2011    History   Social History  . Marital Status: Married    Spouse Name: Acupuncturist    Number of Children: 3  . Years of Education: college   Occupational History  . Homemaker    Social History Main Topics  . Smoking status: Never Smoker   . Smokeless tobacco: Never Used  . Alcohol Use: No  . Drug Use: No  . Sexually Active: Yes    Birth Control/ Protection: Post-menopausal   Other Topics Concern  . 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   Right handed    Past Surgical History  Procedure Laterality Date  . Appendectomy    . Knee surgery      Family History  Problem Relation Age of Onset  . Hypertension Mother     deceased at 43   . Hepatitis Father     ? autoimmune  . Pancreatitis Sister   . Pernicious anemia Sister   . Ovarian cancer Sister     age 25    Allergies  Allergen Reactions  . Codeine Anaphylaxis  . Tetanus Toxoids Swelling    Current Outpatient Prescriptions on File Prior to Visit  Medication Sig Dispense Refill  . BOTOX 200 UNITS SOLR For migraines      . Diclofenac Potassium (CAMBIA) 50 MG PACK Take 50 mg by mouth. Once on the onset of headache/migraine      . SUMAtriptan (IMITREX) 6 MG/0.5ML SOLN injection USE AS NEEDED FOR MIGRAINE  7 mL  3  . topiramate (TOPAMAX) 200 MG tablet TAKE 1 TABLET BY MOUTH TWICE A DAY  60 tablet  6   Current Facility-Administered Medications on File Prior to Visit  Medication Dose Route Frequency Provider Last Rate Last Dose  . botulinum toxin Type A (BOTOX) injection 150 Units  150 Units Intramuscular Once Levert Feinstein, MD        BP 110/80  Pulse 88  Temp(Src) 97.8 F (36.6 C) (Oral)  Resp 18  Wt 125 lb (56.7 kg)  BMI 21.45 kg/m2  SpO2 99%       Objective:   Physical Exam  Constitutional: She is oriented to person, place, and time. She appears well-developed and well-nourished. No distress.  Cardiovascular: Normal rate and regular rhythm.   No murmur heard. Pulmonary/Chest: Effort normal and breath sounds normal. No respiratory distress. She has no wheezes. She has no rales. She exhibits no tenderness.  Musculoskeletal:  Prominent joints noted in fingers without overt swelling or erythema.    Neurological: She is alert and oriented to person, place, and time.  Skin:  Fine petechiae noted on bilateral arms/anterior chest.  Psychiatric: She has a normal mood and affect. Her behavior is normal. Judgment and thought content normal.          Assessment & Plan:

## 2013-06-17 ENCOUNTER — Other Ambulatory Visit: Payer: Self-pay

## 2013-06-17 LAB — ANA: Anti Nuclear Antibody(ANA): NEGATIVE

## 2013-06-17 MED ORDER — TOPIRAMATE 200 MG PO TABS: 200.0000 mg | ORAL_TABLET | Freq: Two times a day (BID) | ORAL | Status: DC

## 2013-06-17 NOTE — Telephone Encounter (Signed)
Pharmacy requested 90 day Rx  

## 2013-06-27 ENCOUNTER — Emergency Department (HOSPITAL_BASED_OUTPATIENT_CLINIC_OR_DEPARTMENT_OTHER)
Admission: EM | Admit: 2013-06-27 | Discharge: 2013-06-27 | Disposition: A | Discharge status: Home / Self Care | Payer: PRIVATE HEALTH INSURANCE | Attending: Emergency Medicine | Admitting: Emergency Medicine

## 2013-06-27 ENCOUNTER — Encounter (HOSPITAL_BASED_OUTPATIENT_CLINIC_OR_DEPARTMENT_OTHER): Payer: Self-pay | Admitting: *Deleted

## 2013-06-27 DIAGNOSIS — M5430 Sciatica, unspecified side: Secondary | ICD-10-CM | POA: Insufficient documentation

## 2013-06-27 DIAGNOSIS — Y929 Unspecified place or not applicable: Secondary | ICD-10-CM | POA: Insufficient documentation

## 2013-06-27 DIAGNOSIS — IMO0002 Reserved for concepts with insufficient information to code with codable children: Secondary | ICD-10-CM | POA: Diagnosis not present

## 2013-06-27 DIAGNOSIS — Z79899 Other long term (current) drug therapy: Secondary | ICD-10-CM | POA: Insufficient documentation

## 2013-06-27 DIAGNOSIS — G43909 Migraine, unspecified, not intractable, without status migrainosus: Secondary | ICD-10-CM | POA: Diagnosis not present

## 2013-06-27 DIAGNOSIS — W108XXA Fall (on) (from) other stairs and steps, initial encounter: Secondary | ICD-10-CM | POA: Insufficient documentation

## 2013-06-27 DIAGNOSIS — Z8739 Personal history of other diseases of the musculoskeletal system and connective tissue: Secondary | ICD-10-CM | POA: Insufficient documentation

## 2013-06-27 DIAGNOSIS — Z8781 Personal history of (healed) traumatic fracture: Secondary | ICD-10-CM | POA: Diagnosis not present

## 2013-06-27 DIAGNOSIS — Y9389 Activity, other specified: Secondary | ICD-10-CM | POA: Insufficient documentation

## 2013-06-27 DIAGNOSIS — M5431 Sciatica, right side: Secondary | ICD-10-CM

## 2013-06-27 MED ORDER — PREDNISONE 20 MG PO TABS: ORAL_TABLET | ORAL | Status: DC

## 2013-06-27 MED ORDER — ONDANSETRON 8 MG PO TBDP
8.0000 mg | ORAL_TABLET | Freq: Once | ORAL | Status: AC
  Administered 2013-06-27: 8 mg via ORAL
  Filled 2013-06-27: qty 1

## 2013-06-27 MED ORDER — METHOCARBAMOL 500 MG PO TABS: 500.0000 mg | ORAL_TABLET | Freq: Two times a day (BID) | ORAL | Status: DC

## 2013-06-27 MED ORDER — PROMETHAZINE HCL 25 MG PO TABS: 25.0000 mg | ORAL_TABLET | Freq: Four times a day (QID) | ORAL | Status: DC | PRN

## 2013-06-27 MED ORDER — OXYCODONE-ACETAMINOPHEN 5-325 MG PO TABS
2.0000 | ORAL_TABLET | Freq: Once | ORAL | Status: AC
  Administered 2013-06-27: 2 via ORAL
  Filled 2013-06-27 (×2): qty 2

## 2013-06-27 MED ORDER — OXYCODONE-ACETAMINOPHEN 5-325 MG PO TABS: 2.0000 | ORAL_TABLET | ORAL | Status: DC | PRN

## 2013-06-27 NOTE — ED Provider Notes (Signed)
History    CSN: [id number] Arrival date & time 06/27/13  1405  First MD Initiated Contact with Patient 06/27/13 1529     Chief Complaint  Patient presents with  . Fall   (Consider location/radiation/quality/duration/timing/severity/associated sxs/prior Treatment) HPI Comments: Is a 52 year old female presents today with right-sided back pain. It is a sharp, burning pain that radiates both upper back and down her right leg. She has history of herniated L1 through L6 20 years ago which she manages without issue. Last night she fell going down the stairs. She was ambulatory after the fall. She has not taken anything for her pain. Her pain is worse with movement and ambulation. Nothing makes the pain better. No bowel or bladder incontinence, IV drug abuse, history of cancer.  Patient is a 52 y.o. female presenting with fall. The history is provided by the patient. No language interpreter was used.  Fall Pertinent negatives include no abdominal pain, chills, fever, nausea or vomiting.   Past Medical History  Diagnosis Date  . Migraine   . Spinal cord injuries   . Rib fractures 2011   Past Surgical History  Procedure Laterality Date  . Appendectomy    . Knee surgery     Family History  Problem Relation Age of Onset  . Hypertension Mother     deceased at 58  . Hepatitis Father     ? autoimmune  . Pancreatitis Sister   . Pernicious anemia Sister   . Ovarian cancer Sister     age 27   History  Substance Use Topics  . Smoking status: Never Smoker   . Smokeless tobacco: Never Used  . Alcohol Use: No   OB History   Grav Para Term Preterm Abortions TAB SAB Ect Mult Living                 Review of Systems  Constitutional: Negative for fever and chills.  Gastrointestinal: Negative for nausea, vomiting and abdominal pain.  Genitourinary: Negative for difficulty urinating.  Musculoskeletal: Positive for back pain and gait problem.  All other systems reviewed and are  negative.    Allergies  Codeine and Tetanus toxoids  Home Medications   Current Outpatient Rx  Name  Route  Sig  Dispense  Refill  . BOTOX 200 UNITS SOLR      For migraines         . Diclofenac Potassium (CAMBIA) 50 MG PACK   Oral   Take 50 mg by mouth. Once on the onset of headache/migraine         . SUMAtriptan (IMITREX) 6 MG/0.5ML SOLN injection      USE AS NEEDED FOR MIGRAINE   7 mL   3   . topiramate (TOPAMAX) 200 MG tablet   Oral   Take 1 tablet (200 mg total) by mouth 2 (two) times daily.   180 tablet   1    BP 113/72  Pulse 86  Temp(Src) 98.1 F (36.7 C) (Oral)  Resp 20  Ht 5\' 5"  (1.651 m)  Wt 123 lb (55.792 kg)  BMI 20.47 kg/m2  SpO2 100% Physical Exam  Nursing note and vitals reviewed. Constitutional: She is oriented to person, place, and time. She appears well-developed and well-nourished. No distress.  HENT:  Head: Normocephalic and atraumatic.  Right Ear: External ear normal.  Left Ear: External ear normal.  Nose: Nose normal.  Mouth/Throat: Oropharynx is clear and moist.  Eyes: Conjunctivae are normal.  Neck: Normal range of motion.  Cardiovascular: Normal rate, regular rhythm, normal heart sounds and intact distal pulses.   Pulmonary/Chest: Effort normal and breath sounds normal. No stridor. No respiratory distress. She has no wheezes. She has no rales.  Abdominal: Soft. She exhibits no distension. There is no tenderness.  Musculoskeletal:       Lumbar back: She exhibits decreased range of motion, tenderness and pain. She exhibits no deformity.  No deformity, step offs  Tender to palpation over SI joint on right  Neurological: She is alert and oriented to person, place, and time. She has normal strength.  Skin: Skin is warm and dry. She is not diaphoretic. No erythema.  Psychiatric: She has a normal mood and affect. Her behavior is normal.    ED Course  Procedures (including critical care time) Labs Reviewed - No data to display No  results found. 1. Sciatica, right     MDM  Patient with back pain.  No neurological deficits and normal neuro exam.  Patient can walk but states is painful.  No loss of bowel or bladder control.  No concern for cauda equina.  No fever, night sweats, weight loss, h/o cancer, IVDU.  RICE protocol and pain medicine indicated and discussed with patient.    Mora Bellman, PA-C 06/30/13 0901  Mora Bellman, PA-C 07/08/13 [phone]

## 2013-06-27 NOTE — ED Notes (Signed)
Pt states she missed a couple of steps last p.m. and fell injuring her back. C/O pain to same.

## 2013-07-02 ENCOUNTER — Ambulatory Visit (INDEPENDENT_AMBULATORY_CARE_PROVIDER_SITE_OTHER): Payer: PRIVATE HEALTH INSURANCE | Admitting: Family Medicine

## 2013-07-02 ENCOUNTER — Encounter: Payer: Self-pay | Admitting: Family Medicine

## 2013-07-02 VITALS — BP 108/70 | HR 83 | Temp 97.9°F | Wt 129.6 lb

## 2013-07-02 DIAGNOSIS — M5126 Other intervertebral disc displacement, lumbar region: Secondary | ICD-10-CM

## 2013-07-02 DIAGNOSIS — H3560 Retinal hemorrhage, unspecified eye: Secondary | ICD-10-CM

## 2013-07-02 DIAGNOSIS — Z0100 Encounter for examination of eyes and vision without abnormal findings: Secondary | ICD-10-CM | POA: Diagnosis not present

## 2013-07-02 DIAGNOSIS — H1131 Conjunctival hemorrhage, right eye: Secondary | ICD-10-CM | POA: Insufficient documentation

## 2013-07-02 MED ORDER — CYCLOBENZAPRINE HCL 10 MG PO TABS: 10.0000 mg | ORAL_TABLET | Freq: Three times a day (TID) | ORAL | Status: DC | PRN
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
## 2013-07-02 NOTE — Patient Instructions (Addendum)
We'll call you with your ortho appt Take the Prednisone as directed- take w/ food HEAT! Use the flexeril as directed- will cause drowsiness The burst blood vessel may look slightly worse before it improves but it will resolve w/ time If you develop pain, visual changes, or other concerns- please call or go to the eye doctor Sherri Rad in there!

## 2013-07-02 NOTE — Progress Notes (Signed)
  Subjective:    Patient ID: Tasha Cruz, female    DOB: August 10, 1961, 52 y.o.   MRN: [id number]  HPI S/p fall- occurred Friday 7/11.  Has hx of bulging L1-5.  'every now and then when it flares, my legs give out and I fall'.  Fell down the steps this time.  Was unable to get out of bed due to pain.  Went to ER.  Was given prednisone, pain meds, muscle relaxer.  Still in pain.  This afternoon noted broken blood vessel in R eye.  Has rheum visit pending for petechiae.  Today also noted numbness of L 4th and 5th fingers.  Denies visual changes, N/V, current HA (although has hx of migraines), eye pain.   Review of Systems For ROS see HPI     Objective:   Physical Exam  Vitals reviewed. Constitutional: She appears well-developed and well-nourished.  Eyes: EOM are normal. Pupils are equal, round, and reactive to light.  R subconjuctival hemorrhage laterally  Musculoskeletal: She exhibits tenderness (over L spine).  Neurological:  Antalgic gait + SLR on R  Psychiatric:  Very anxious          Assessment & Plan:

## 2013-07-02 NOTE — Assessment & Plan Note (Signed)
New.  Likely due to trauma of recent fall.  Vision WNL.  No eye pain, no photophobia.  Reviewed dx and benign nature- including self limited course.  Reviewed supportive care and red flags that should prompt return.  Pt expressed understanding and is in agreement w/ plan.

## 2013-07-06 DIAGNOSIS — M47817 Spondylosis without myelopathy or radiculopathy, lumbosacral region: Secondary | ICD-10-CM | POA: Diagnosis not present

## 2013-07-08 NOTE — ED Provider Notes (Signed)
Medical screening examination/treatment/procedure(s) were performed by non-physician practitioner and as supervising physician I was immediately available for consultation/collaboration.   Gavin Pound. Oletta Lamas, MD 07/08/13 [phone]

## 2013-07-11 DIAGNOSIS — M545 Low back pain, unspecified: Secondary | ICD-10-CM | POA: Diagnosis not present

## 2013-07-30 DIAGNOSIS — M25549 Pain in joints of unspecified hand: Secondary | ICD-10-CM | POA: Diagnosis not present

## 2013-07-30 DIAGNOSIS — IMO0001 Reserved for inherently not codable concepts without codable children: Secondary | ICD-10-CM | POA: Diagnosis not present

## 2013-07-30 DIAGNOSIS — M25579 Pain in unspecified ankle and joints of unspecified foot: Secondary | ICD-10-CM | POA: Diagnosis not present

## 2013-07-30 DIAGNOSIS — R5381 Other malaise: Secondary | ICD-10-CM | POA: Diagnosis not present

## 2013-07-30 DIAGNOSIS — M2550 Pain in unspecified joint: Secondary | ICD-10-CM | POA: Diagnosis not present

## 2013-07-30 DIAGNOSIS — M25569 Pain in unspecified knee: Secondary | ICD-10-CM | POA: Diagnosis not present

## 2013-08-19 ENCOUNTER — Encounter: Payer: Self-pay | Admitting: Neurology

## 2013-08-19 ENCOUNTER — Ambulatory Visit (INDEPENDENT_AMBULATORY_CARE_PROVIDER_SITE_OTHER): Payer: PRIVATE HEALTH INSURANCE | Admitting: Neurology

## 2013-08-19 VITALS — BP 110/72 | HR 73 | Ht 65.0 in | Wt 123.0 lb

## 2013-08-19 DIAGNOSIS — G43719 Chronic migraine without aura, intractable, without status migrainosus: Secondary | ICD-10-CM

## 2013-08-19 DIAGNOSIS — G43909 Migraine, unspecified, not intractable, without status migrainosus: Secondary | ICD-10-CM

## 2013-08-19 MED ORDER — ONABOTULINUMTOXINA 100 UNITS IJ SOLR
150.0000 [IU] | Freq: Once | INTRAMUSCULAR | Status: AC
  Administered 2013-08-19: 150 [IU] via INTRAMUSCULAR

## 2013-08-19 MED ORDER — DICLOFENAC POTASSIUM(MIGRAINE) 50 MG PO PACK: 50.0000 mg | PACK | ORAL | Status: DC | PRN

## 2013-08-19 MED ORDER — TOPIRAMATE 200 MG PO TABS: 200.0000 mg | ORAL_TABLET | Freq: Two times a day (BID) | ORAL | Status: DC

## 2013-08-19 MED ORDER — SUMATRIPTAN SUCCINATE 6 MG/0.5ML ~~LOC~~ SOLN: 6.0000 mg | SUBCUTANEOUS | Status: DC | PRN
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
## 2013-08-24 DIAGNOSIS — G4700 Insomnia, unspecified: Secondary | ICD-10-CM | POA: Diagnosis not present

## 2013-08-24 DIAGNOSIS — IMO0001 Reserved for inherently not codable concepts without codable children: Secondary | ICD-10-CM | POA: Diagnosis not present

## 2013-08-24 DIAGNOSIS — R5381 Other malaise: Secondary | ICD-10-CM | POA: Diagnosis not present

## 2013-08-24 DIAGNOSIS — M5137 Other intervertebral disc degeneration, lumbosacral region: Secondary | ICD-10-CM | POA: Diagnosis not present

## 2013-10-01 IMAGING — CR DG CHEST 2V
2 series · 2 of 2 positions shown · non-contrast
Comparison: 01/30/2012 and 01/19/2012

CLINICAL DATA: Cough

CHEST - 2 VIEW

[w chest pa]
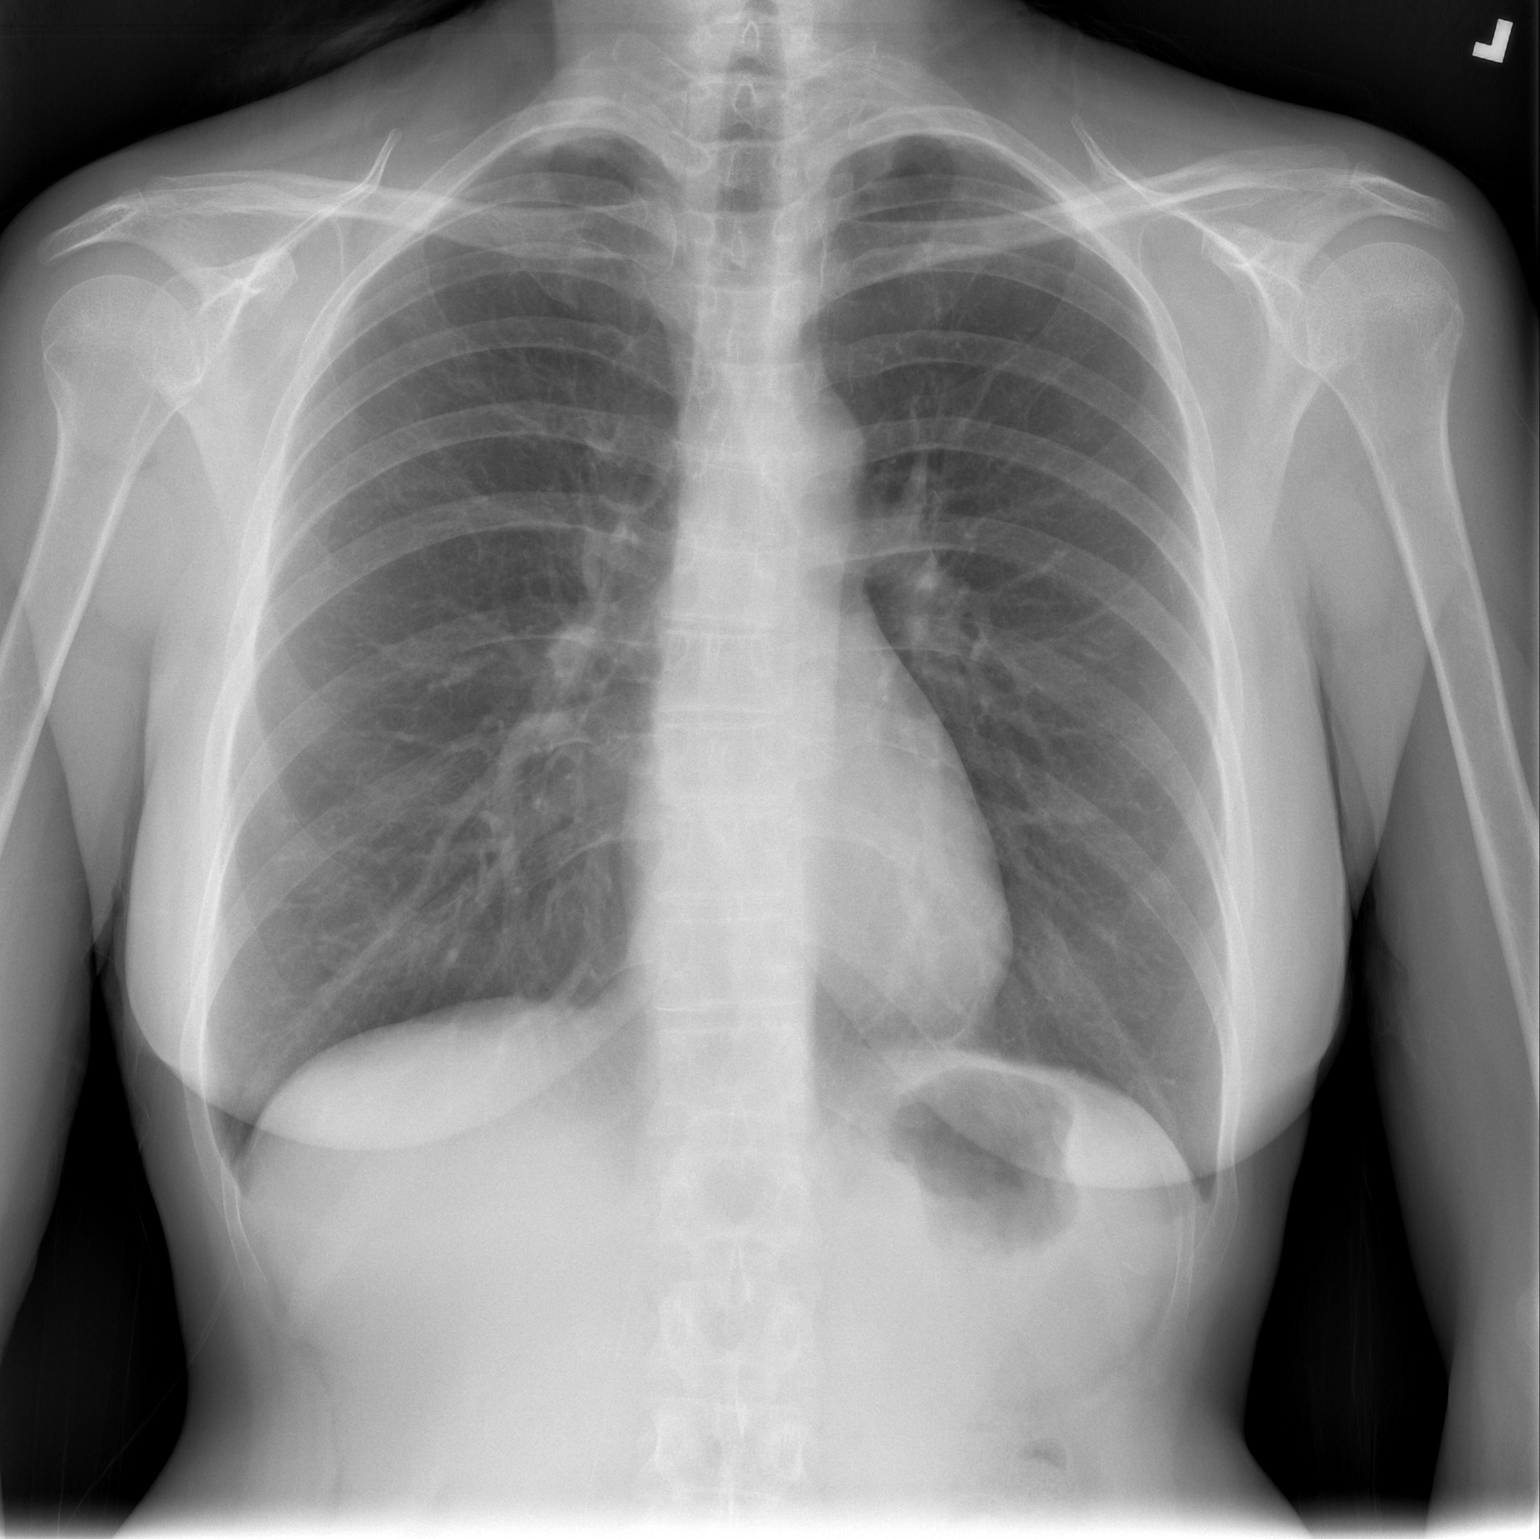

[w chest lat]
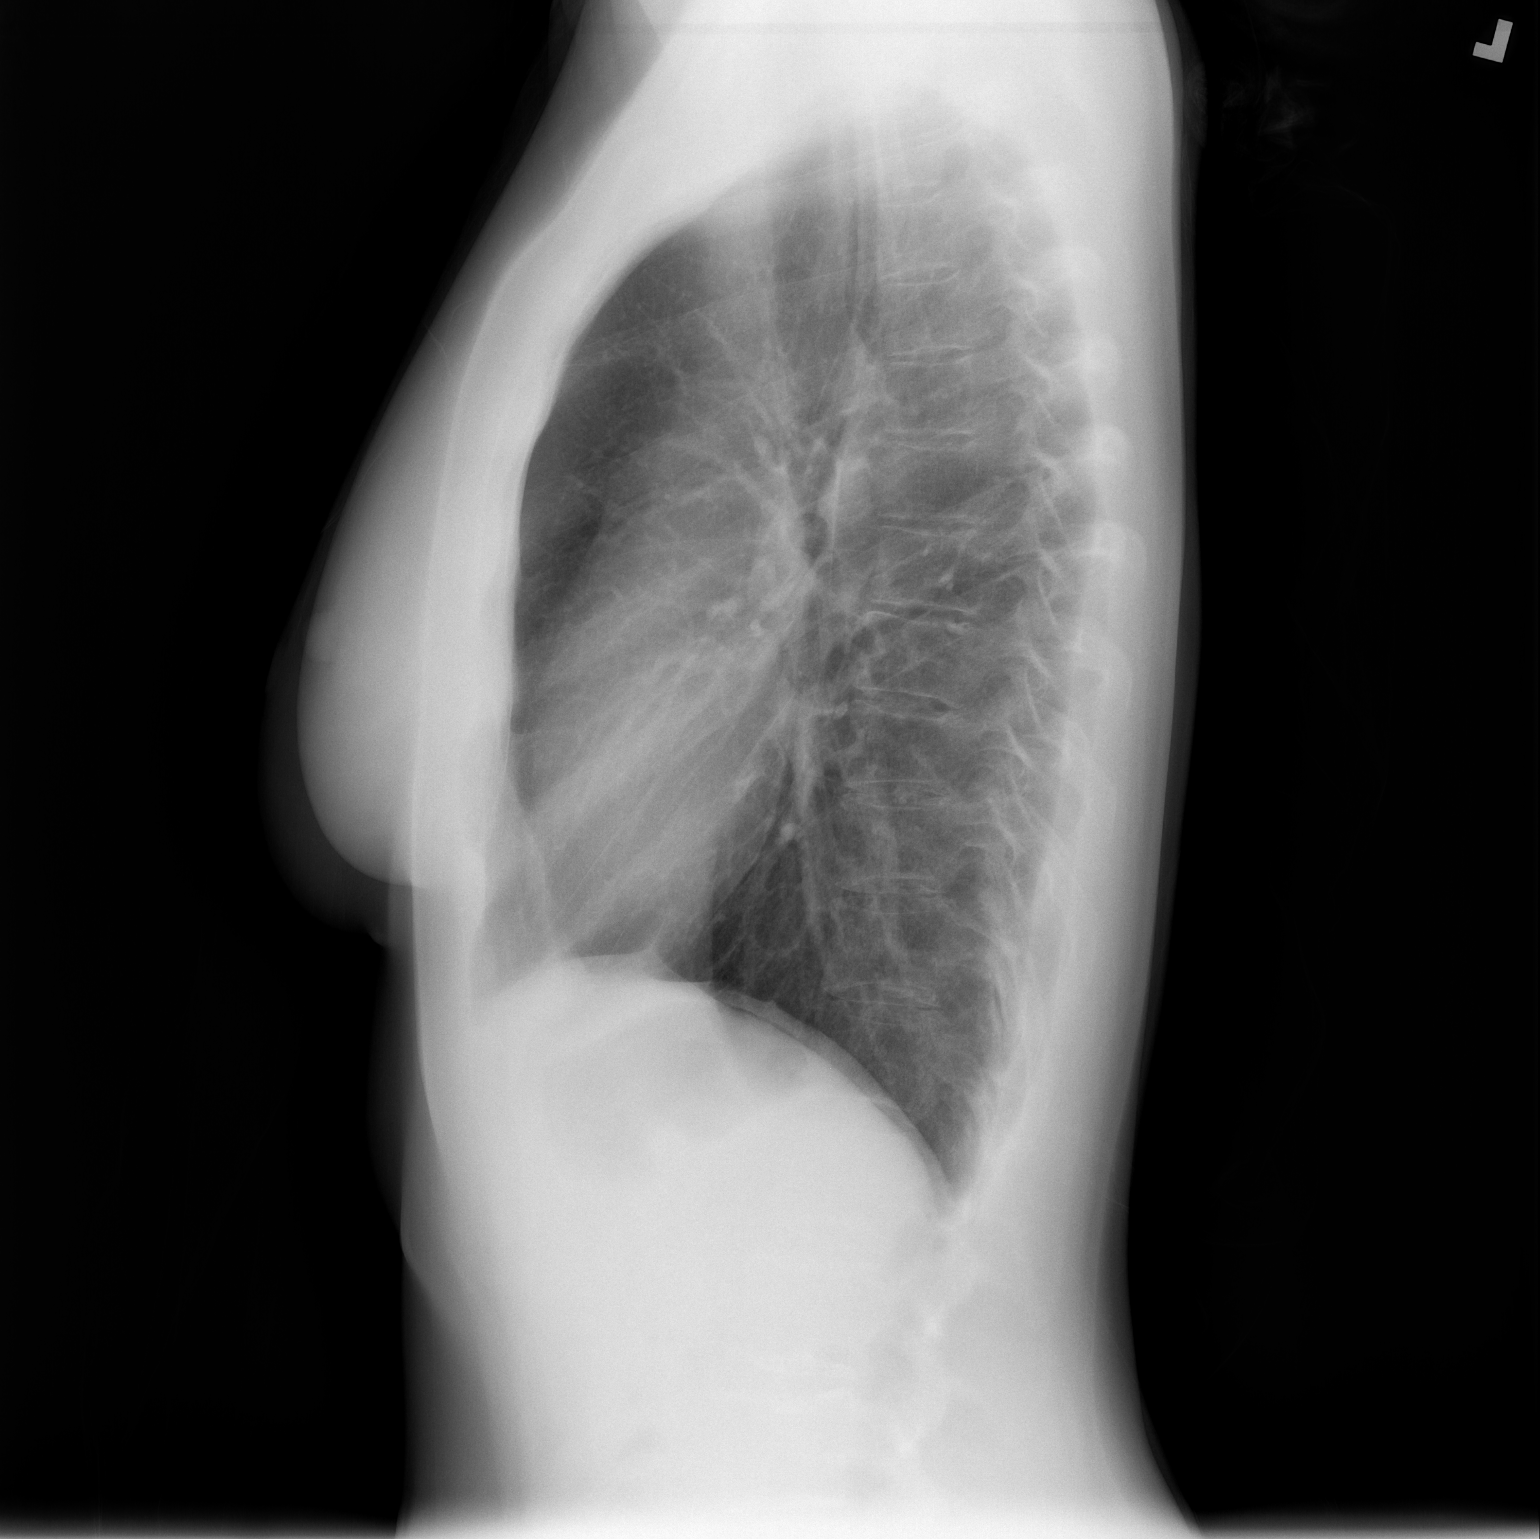

[2 of 2 positions shown; findings below may reference images not displayed]

FINDINGS: Cardiomediastinal silhouette is unremarkable.  No acute
infiltrate or pleural effusion.  No pulmonary edema.  Bony thorax
is unremarkable.
IMPRESSION: No active disease.  No significant change.

## 2013-11-19 ENCOUNTER — Encounter (HOSPITAL_BASED_OUTPATIENT_CLINIC_OR_DEPARTMENT_OTHER): Payer: Self-pay | Admitting: Emergency Medicine

## 2013-11-19 ENCOUNTER — Emergency Department (HOSPITAL_BASED_OUTPATIENT_CLINIC_OR_DEPARTMENT_OTHER)
Admission: EM | Admit: 2013-11-19 | Discharge: 2013-11-19 | Disposition: A | Discharge status: Home / Self Care | Payer: PRIVATE HEALTH INSURANCE | Attending: Emergency Medicine | Admitting: Emergency Medicine

## 2013-11-19 DIAGNOSIS — Z8781 Personal history of (healed) traumatic fracture: Secondary | ICD-10-CM | POA: Diagnosis not present

## 2013-11-19 DIAGNOSIS — Z87828 Personal history of other (healed) physical injury and trauma: Secondary | ICD-10-CM | POA: Diagnosis not present

## 2013-11-19 DIAGNOSIS — G43909 Migraine, unspecified, not intractable, without status migrainosus: Secondary | ICD-10-CM

## 2013-11-19 DIAGNOSIS — Z79899 Other long term (current) drug therapy: Secondary | ICD-10-CM | POA: Diagnosis not present

## 2013-11-19 MED ORDER — KETOROLAC TROMETHAMINE 30 MG/ML IJ SOLN
30.0000 mg | Freq: Once | INTRAMUSCULAR | Status: AC
  Administered 2013-11-19: 30 mg via INTRAVENOUS
  Filled 2013-11-19: qty 1

## 2013-11-19 MED ORDER — DEXAMETHASONE SODIUM PHOSPHATE 10 MG/ML IJ SOLN
10.0000 mg | Freq: Once | INTRAMUSCULAR | Status: AC
  Administered 2013-11-19: 10 mg via INTRAVENOUS
  Filled 2013-11-19: qty 1

## 2013-11-19 MED ORDER — PROMETHAZINE HCL 25 MG/ML IJ SOLN
12.5000 mg | Freq: Once | INTRAMUSCULAR | Status: AC
  Administered 2013-11-19: 12.5 mg via INTRAVENOUS
  Filled 2013-11-19: qty 1

## 2013-11-19 MED ORDER — SODIUM CHLORIDE 0.9 % IV BOLUS (SEPSIS)
1000.0000 mL | Freq: Once | INTRAVENOUS | Status: AC
  Administered 2013-11-19: 1000 mL via INTRAVENOUS

## 2013-11-19 MED ORDER — DIPHENHYDRAMINE HCL 50 MG/ML IJ SOLN
25.0000 mg | Freq: Once | INTRAMUSCULAR | Status: AC
  Administered 2013-11-19: 25 mg via INTRAVENOUS
  Filled 2013-11-19: qty 1

## 2013-11-19 MED ORDER — SODIUM CHLORIDE 0.9 % IV SOLN
Freq: Once | INTRAVENOUS | Status: AC
  Administered 2013-11-19: 1000 mL via INTRAVENOUS

## 2013-11-19 MED ORDER — METOCLOPRAMIDE HCL 5 MG/ML IJ SOLN
10.0000 mg | Freq: Once | INTRAMUSCULAR | Status: AC
  Administered 2013-11-19: 10 mg via INTRAVENOUS
  Filled 2013-11-19: qty 2

## 2013-11-19 NOTE — ED Notes (Signed)
Headache x 2 days. Hx of migraines

## 2013-11-19 NOTE — ED Provider Notes (Signed)
CSN: [id number]     Arrival date & time 11/19/13  1714 History   First MD Initiated Contact with Patient 11/19/13 1716     Chief Complaint  Patient presents with  . Migraine   (Consider location/radiation/quality/duration/timing/severity/associated sxs/prior Treatment) HPI Comments: Pt has a history of migraines and this is similar she just couldn't get it under control at home:pt is scheduled for botox injections again next week  Patient is a 52 y.o. female presenting with migraines. The history is provided by the patient.  Migraine This is a chronic problem. The current episode started in the past 7 days. The problem occurs constantly. The problem has been gradually worsening. Associated symptoms include vomiting. Pertinent negatives include no fever. Exacerbated by: light and sound. Treatments tried: imitrex.    Past Medical History  Diagnosis Date  . Migraine   . Spinal cord injuries   . Rib fractures 2011   Past Surgical History  Procedure Laterality Date  . Appendectomy    . Knee surgery     Family History  Problem Relation Age of Onset  . Hypertension Mother     deceased at 22  . Hepatitis Father     ? autoimmune  . Pancreatitis Sister   . Pernicious anemia Sister   . Ovarian cancer Sister     age 58   History  Substance Use Topics  . Smoking status: Never Smoker   . Smokeless tobacco: Never Used  . Alcohol Use: No   OB History   Grav Para Term Preterm Abortions TAB SAB Ect Mult Living                 Review of Systems  Constitutional: Negative.  Negative for fever.  Respiratory: Negative.   Cardiovascular: Negative.   Gastrointestinal: Positive for vomiting.    Allergies  Codeine and Tetanus toxoids  Home Medications   Current Outpatient Rx  Name  Route  Sig  Dispense  Refill  . BOTOX 200 UNITS SOLR      For migraines         . CELEBREX 200 MG capsule   Oral   Take 200 mg by mouth 2 (two) times daily.          . cyclobenzaprine  (FLEXERIL) 10 MG tablet   Oral   Take 1 tablet (10 mg total) by mouth 3 (three) times daily as needed for muscle spasms.   45 tablet   0   . Diclofenac Potassium (CAMBIA) 50 MG PACK   Oral   Take 50 mg by mouth as needed. Once on the onset of headache/migraine   30 each   6   . methocarbamol (ROBAXIN) 500 MG tablet   Oral   Take 500 mg by mouth daily.         . SUMAtriptan (IMITREX) 6 MG/0.5ML SOLN injection   Subcutaneous   Inject 0.5 mLs (6 mg total) into the skin every 2 (two) hours as needed for migraine or headache (maximum 2 dose in a day). F   7 mL   3   . topiramate (TOPAMAX) 200 MG tablet   Oral   Take 1 tablet (200 mg total) by mouth 2 (two) times daily.   180 tablet   3    BP 128/52  Pulse 109  Temp(Src) 97.6 F (36.4 C) (Oral)  Resp 16  Ht 5\' 5"  (1.651 m)  Wt 120 lb (54.432 kg)  BMI 19.97 kg/m2  SpO2 100% Physical Exam  Nursing  note and vitals reviewed. Constitutional: She appears well-developed and well-nourished.  HENT:  Head: Normocephalic and atraumatic.  Eyes: EOM are normal. Pupils are equal, round, and reactive to light.  Neck: Normal range of motion. Neck supple.  Cardiovascular: Normal rate and regular rhythm.   Pulmonary/Chest: Breath sounds normal.  Musculoskeletal: Normal range of motion.    ED Course  Procedures (including critical care time) Labs Review Labs Reviewed - No data to display Imaging Review No results found.  EKG Interpretation   None       MDM   1. Migraine    Pt states that she feels well enough to go home    Teressa Lower, NP 11/19/13 2025

## 2013-11-20 NOTE — ED Provider Notes (Signed)
Medical screening examination/treatment/procedure(s) were performed by non-physician practitioner and as supervising physician I was immediately available for consultation/collaboration.  EKG Interpretation   None         Edelmiro Innocent N Deva Ron, DO 11/20/13 0012 

## 2013-12-02 ENCOUNTER — Ambulatory Visit (INDEPENDENT_AMBULATORY_CARE_PROVIDER_SITE_OTHER): Payer: PRIVATE HEALTH INSURANCE | Admitting: Neurology

## 2013-12-02 ENCOUNTER — Encounter: Payer: Self-pay | Admitting: Neurology

## 2013-12-02 VITALS — BP 105/68 | HR 79 | Ht 65.0 in | Wt 123.0 lb

## 2013-12-02 DIAGNOSIS — G43719 Chronic migraine without aura, intractable, without status migrainosus: Secondary | ICD-10-CM | POA: Diagnosis not present

## 2013-12-02 DIAGNOSIS — G43909 Migraine, unspecified, not intractable, without status migrainosus: Secondary | ICD-10-CM

## 2013-12-02 MED ORDER — ONABOTULINUMTOXINA 100 UNITS IJ SOLR
150.0000 [IU] | Freq: Once | INTRAMUSCULAR | Status: AC
  Administered 2013-12-02: 150 [IU] via INTRAMUSCULAR

## 2013-12-02 MED ORDER — TOPIRAMATE 200 MG PO TABS: 200.0000 mg | ORAL_TABLET | Freq: Two times a day (BID) | ORAL | Status: DC

## 2013-12-02 MED ORDER — DICLOFENAC POTASSIUM(MIGRAINE) 50 MG PO PACK: 50.0000 mg | PACK | ORAL | Status: DC | PRN

## 2013-12-02 MED ORDER — SUMATRIPTAN SUCCINATE 6 MG/0.5ML ~~LOC~~ SOLN: 6.0000 mg | SUBCUTANEOUS | Status: DC | PRN
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
## 2014-03-03 ENCOUNTER — Encounter: Payer: Self-pay | Admitting: Neurology

## 2014-03-03 ENCOUNTER — Ambulatory Visit (INDEPENDENT_AMBULATORY_CARE_PROVIDER_SITE_OTHER): Payer: PRIVATE HEALTH INSURANCE | Admitting: Neurology

## 2014-03-03 VITALS — BP 89/53 | HR 68 | Ht 64.0 in | Wt 125.0 lb

## 2014-03-03 DIAGNOSIS — G43719 Chronic migraine without aura, intractable, without status migrainosus: Secondary | ICD-10-CM | POA: Diagnosis not present

## 2014-03-03 DIAGNOSIS — G43909 Migraine, unspecified, not intractable, without status migrainosus: Secondary | ICD-10-CM

## 2014-03-03 MED ORDER — TOPIRAMATE 200 MG PO TABS: 200.0000 mg | ORAL_TABLET | Freq: Two times a day (BID) | ORAL | Status: DC

## 2014-03-03 MED ORDER — SUMATRIPTAN SUCCINATE 6 MG/0.5ML ~~LOC~~ SOAJ: SUBCUTANEOUS | Status: DC

## 2014-03-03 MED ORDER — CYCLOBENZAPRINE HCL 10 MG PO TABS: 10.0000 mg | ORAL_TABLET | Freq: Three times a day (TID) | ORAL | Status: DC | PRN
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
## 2014-05-28 ENCOUNTER — Telehealth: Payer: Self-pay | Admitting: Neurology

## 2014-05-28 NOTE — Telephone Encounter (Signed)
Spoke with patient and she has been having migraines since Sunday, not sleeping, head is throbbing, eyes popping and the prescribed medication is not helping at this point. The pharmacy has squed up with the botox and can't do that right now , just would like something to break cycle of these migraines.

## 2014-05-28 NOTE — Telephone Encounter (Signed)
Got message late in the day and spoke to patient she said if it gets worse she will go to the ED.  I will check on her Monday.

## 2014-05-28 NOTE — Telephone Encounter (Signed)
I have called Drinda Buttsnnette, she had severe migraine headaches since Sunday June 7th 2015, difficult to break with recurrent use of triptan treatment including Imitrex shot, cambia, She had given herself another Imitrex shot today around 10:30am, I have advised to continue observe response,  Lupita LeashDonna, please call her around 12:30, if she still has severe headaches,you may let her come in for IV Depacon infusion, including Toradol 30 mg, Compazine 10 mg if needed

## 2014-05-28 NOTE — Telephone Encounter (Signed)
Pt is suffering from a severe migraine has had it for 4 days and would like to come in she is scheduled for next botox injection on Wednesday. But needs some relief.

## 2014-06-02 ENCOUNTER — Ambulatory Visit (INDEPENDENT_AMBULATORY_CARE_PROVIDER_SITE_OTHER): Payer: PRIVATE HEALTH INSURANCE | Admitting: Neurology

## 2014-06-02 ENCOUNTER — Encounter: Payer: Self-pay | Admitting: Neurology

## 2014-06-02 VITALS — BP 108/65 | HR 94 | Ht 65.0 in | Wt 115.0 lb

## 2014-06-02 DIAGNOSIS — G43719 Chronic migraine without aura, intractable, without status migrainosus: Secondary | ICD-10-CM | POA: Diagnosis not present

## 2014-06-02 DIAGNOSIS — G43909 Migraine, unspecified, not intractable, without status migrainosus: Secondary | ICD-10-CM

## 2014-06-02 MED ORDER — CLONAZEPAM 0.5 MG PO TABS: 0.5000 mg | ORAL_TABLET | ORAL | Status: DC | PRN
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
## 2014-09-01 ENCOUNTER — Encounter: Payer: Self-pay | Admitting: Neurology

## 2014-09-01 ENCOUNTER — Encounter (INDEPENDENT_AMBULATORY_CARE_PROVIDER_SITE_OTHER): Payer: Self-pay

## 2014-09-01 ENCOUNTER — Ambulatory Visit (INDEPENDENT_AMBULATORY_CARE_PROVIDER_SITE_OTHER): Payer: PRIVATE HEALTH INSURANCE | Admitting: Neurology

## 2014-09-01 DIAGNOSIS — G43719 Chronic migraine without aura, intractable, without status migrainosus: Secondary | ICD-10-CM

## 2014-09-01 DIAGNOSIS — G43009 Migraine without aura, not intractable, without status migrainosus: Secondary | ICD-10-CM

## 2014-09-01 MED ORDER — SUMATRIPTAN SUCCINATE 6 MG/0.5ML ~~LOC~~ SOAJ: 6.0000 mg | SUBCUTANEOUS | Status: DC | PRN

## 2014-09-01 MED ORDER — CYCLOBENZAPRINE HCL 10 MG PO TABS: 10.0000 mg | ORAL_TABLET | Freq: Three times a day (TID) | ORAL | Status: AC | PRN

## 2014-09-01 MED ORDER — DICLOFENAC POTASSIUM(MIGRAINE) 50 MG PO PACK: 50.0000 mg | PACK | ORAL | Status: DC | PRN

## 2014-09-01 MED ORDER — TOPIRAMATE 200 MG PO TABS: 200.0000 mg | ORAL_TABLET | Freq: Two times a day (BID) | ORAL | Status: AC
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
## 2014-12-01 ENCOUNTER — Encounter: Payer: Self-pay | Admitting: Neurology

## 2014-12-01 ENCOUNTER — Ambulatory Visit (INDEPENDENT_AMBULATORY_CARE_PROVIDER_SITE_OTHER): Payer: Commercial Managed Care - PPO | Admitting: Neurology

## 2014-12-01 DIAGNOSIS — G43009 Migraine without aura, not intractable, without status migrainosus: Secondary | ICD-10-CM

## 2014-12-01 DIAGNOSIS — G43719 Chronic migraine without aura, intractable, without status migrainosus: Secondary | ICD-10-CM | POA: Diagnosis not present

## 2014-12-01 MED ORDER — ONABOTULINUMTOXINA 100 UNITS IJ SOLR
200.0000 [IU] | Freq: Once | INTRAMUSCULAR | Status: AC
  Administered 2014-12-01: 200 [IU] via INTRAMUSCULAR

## 2014-12-01 MED ORDER — SUMATRIPTAN SUCCINATE 6 MG/0.5ML ~~LOC~~ SOLN
6.0000 mg | SUBCUTANEOUS | Status: DC | PRN
Start: 2014-12-01 — End: 2015-12-03
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
## 2014-12-03 MED ORDER — ONABOTULINUMTOXINA 100 UNITS IJ SOLR: 200.0000 [IU] | Freq: Once | INTRAMUSCULAR | Status: AC

## 2014-12-07 MED ORDER — ONABOTULINUMTOXINA 100 UNITS IJ SOLR
200.0000 [IU] | Freq: Once | INTRAMUSCULAR | Status: AC
  Administered 2014-12-07: 200 [IU] via INTRAMUSCULAR

## 2014-12-07 NOTE — Addendum Note (Signed)
Addended by: Levert FeinsteinYAN, Adolpho Meenach on: 12/07/2014 10:27 AM   Modules accepted: Orders

## 2015-01-14 ENCOUNTER — Other Ambulatory Visit: Payer: Self-pay | Admitting: Neurology

## 2015-03-08 ENCOUNTER — Encounter: Payer: Self-pay | Admitting: *Deleted

## 2015-03-09 ENCOUNTER — Encounter: Payer: Self-pay | Admitting: Neurology

## 2015-03-09 ENCOUNTER — Ambulatory Visit (INDEPENDENT_AMBULATORY_CARE_PROVIDER_SITE_OTHER): Payer: Commercial Managed Care - PPO | Admitting: Neurology

## 2015-03-09 DIAGNOSIS — G43719 Chronic migraine without aura, intractable, without status migrainosus: Secondary | ICD-10-CM | POA: Diagnosis not present

## 2015-03-09 MED ORDER — ONABOTULINUMTOXINA 100 UNITS IJ SOLR
200.0000 [IU] | Freq: Once | INTRAMUSCULAR | Status: AC
  Administered 2015-03-09: 200 [IU] via INTRAMUSCULAR
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
## 2015-04-07 ENCOUNTER — Telehealth: Payer: Self-pay | Admitting: Neurology

## 2015-04-07 ENCOUNTER — Encounter: Payer: Self-pay | Admitting: Neurology

## 2015-04-07 NOTE — Telephone Encounter (Signed)
mailed letter to inform patient of workflow change and discontinuing injections. °

## 2015-04-13 ENCOUNTER — Telehealth: Payer: Self-pay | Admitting: Neurology

## 2015-04-13 NOTE — Telephone Encounter (Signed)
Pt's spouse is calling stating that the pt needs a referral to an office that she can get Botox treatment. Please call and advise.

## 2015-04-15 NOTE — Telephone Encounter (Signed)
Hold referral - pending Botox meeting - pt aware. 

## 2015-04-26 ENCOUNTER — Telehealth: Payer: Self-pay

## 2015-04-26 NOTE — Telephone Encounter (Signed)
Patient called requesting that all medical records [facility] Fax: [phone]  She states she has already filled out a release form. Thanks

## 2015-04-27 NOTE — Telephone Encounter (Signed)
Patient needs to sign a release form.

## 2015-05-04 DIAGNOSIS — G43709 Chronic migraine without aura, not intractable, without status migrainosus: Secondary | ICD-10-CM | POA: Diagnosis not present

## 2015-10-12 ENCOUNTER — Encounter: Payer: Self-pay | Admitting: Family

## 2015-10-12 ENCOUNTER — Ambulatory Visit (INDEPENDENT_AMBULATORY_CARE_PROVIDER_SITE_OTHER): Payer: Commercial Managed Care - PPO | Admitting: Family

## 2015-10-12 VITALS — BP 107/60 | HR 77 | Temp 97.6°F | Resp 18 | Wt 133.2 lb

## 2015-10-12 DIAGNOSIS — M545 Low back pain, unspecified: Secondary | ICD-10-CM

## 2015-10-12 MED ORDER — METHYLPREDNISOLONE 4 MG PO TBPK: ORAL_TABLET | ORAL | Status: AC

## 2015-10-12 NOTE — Progress Notes (Signed)
Subjective:    Patient ID: Tasha Cruz, female    DOB: Jun 24, 1961, 54 y.o.   MRN: [id number]  HPI  [name] is a 54 yr old female who presents today with chief complaint of low back pain. She has hx of chronic back pain and chronic back issues. Reports that she recently had an MRI performed of her lumbar spine with orthopedics. Low back pain has been present x 3 weeks.  Has been using flexeril without improvement. She reports that back pain is better in the middle of her back- but more on the right than left.  She reports that she has intermittent radiation of pain down the right leg and right leg numbness but this is not any worse than her baseline. She denies known injury.  She also tried baclofen which she has on hand for her migraines and this has not helped her back pain.    Notes some mild left foot pain- no injury.   Review of Systems    see HPI  Past Medical History  Diagnosis Date  . Migraine   . Spinal cord injuries   . Rib fractures 2011    Social History   Social History  . Marital Status: Married    Spouse Name: Acupuncturist  . Number of Children: 3  . Years of Education: college   Occupational History  . Homemaker    Social History Main Topics  . Smoking status: Never Smoker   . Smokeless tobacco: Never Used  . Alcohol Use: No  . Drug Use: No  . Sexual Activity: Yes    Birth Control/ Protection: Post-menopausal   Other Topics Concern  . 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   Right handed   Caffeine- One or two cups daily.    Past Surgical History  Procedure Laterality Date  . Appendectomy    . Knee surgery      Family History  Problem Relation Age of Onset  . Hypertension Mother     deceased at 28  . Hepatitis Father     ? autoimmune  . Pancreatitis  Sister   . Pernicious anemia Sister   . Ovarian cancer Sister     age 33    Allergies  Allergen Reactions  . Clonazepam     Patient can't remember.- Memory change.  . Codeine Anaphylaxis  . Oxycontin [Oxycodone Hcl] Anaphylaxis  . Tetanus Toxoids Swelling    Current Outpatient Prescriptions on File Prior to Visit  Medication Sig Dispense Refill  . cyclobenzaprine (FLEXERIL) 10 MG tablet Take 1 tablet (10 mg total) by mouth 3 (three) times daily as needed for muscle spasms. 90 tablet 3  . SUMAtriptan (IMITREX) 6 MG/0.5ML SOLN injection Inject 0.5 mLs (6 mg total) into the skin as needed for migraine or headache. 6 mL 11  . topiramate (TOPAMAX) 200 MG tablet Take 1 tablet (200 mg total) by mouth 2 (two) times daily. 180 tablet 3   Current Facility-Administered Medications on File Prior to Visit  Medication Dose Route Frequency Provider Last Rate Last Dose  . botulinum toxin Type A (BOTOX) injection 200 Units  200 Units Intramuscular Once Levert Feinstein, MD        BP 107/60 mmHg  Pulse 77  Temp(Src)  97.6 F (36.4 C) (Oral)  Resp 18  Wt 133 lb 3.2 oz (60.419 kg)  SpO2 100%    Objective:   Physical Exam  Constitutional: She appears well-developed and well-nourished.  Cardiovascular: Normal rate, regular rhythm and normal heart sounds.   No murmur heard. Pulmonary/Chest: Effort normal and breath sounds normal. No respiratory distress. She has no wheezes.  Musculoskeletal:       Cervical back: She exhibits no tenderness.       Thoracic back: She exhibits no tenderness.       Lumbar back: She exhibits tenderness.  Left foot without swelling.    Neurological:  Reflex Scores:      Patellar reflexes are 1+ on the right side and 2+ on the left side. Bilateral LE strength is 5/5  Psychiatric: She has a normal mood and affect. Her behavior is normal. Judgment and thought content normal.          Assessment & Plan:  Acute low back pain- will rx with medrol dose pak.  Continue  flexeril prn, heat prn, call if symptoms worsen or do not improve.   Left foot pain- she wishes to watch for now, declines x ray.

## 2015-10-12 NOTE — Patient Instructions (Signed)
Start medrol dose pak for your back. You may continue flexeril as needed, apply heat as needed. Call if pain worsens, if you develop worsening leg weakness/numbness, or if pain is not improved in 1-2 weeks.

## 2015-10-12 NOTE — Progress Notes (Signed)
Pre visit review using our clinic review tool, if applicable. No additional management support is needed unless otherwise documented below in the visit note. 

## 2015-10-13 DIAGNOSIS — G43809 Other migraine, not intractable, without status migrainosus: Secondary | ICD-10-CM | POA: Diagnosis not present

## 2015-10-13 DIAGNOSIS — G43709 Chronic migraine without aura, not intractable, without status migrainosus: Secondary | ICD-10-CM | POA: Diagnosis not present

## 2015-10-17 DIAGNOSIS — M722 Plantar fascial fibromatosis: Secondary | ICD-10-CM | POA: Diagnosis not present

## 2015-10-17 DIAGNOSIS — M6702 Short Achilles tendon (acquired), left ankle: Secondary | ICD-10-CM | POA: Diagnosis not present

## 2015-10-17 DIAGNOSIS — M76829 Posterior tibial tendinitis, unspecified leg: Secondary | ICD-10-CM | POA: Diagnosis not present

## 2015-10-31 DIAGNOSIS — M76829 Posterior tibial tendinitis, unspecified leg: Secondary | ICD-10-CM | POA: Diagnosis not present

## 2015-10-31 DIAGNOSIS — M6702 Short Achilles tendon (acquired), left ankle: Secondary | ICD-10-CM | POA: Diagnosis not present

## 2015-10-31 DIAGNOSIS — M722 Plantar fascial fibromatosis: Secondary | ICD-10-CM | POA: Diagnosis not present

## 2015-12-03 ENCOUNTER — Other Ambulatory Visit: Payer: Self-pay | Admitting: Neurology

## 2015-12-07 DIAGNOSIS — M76829 Posterior tibial tendinitis, unspecified leg: Secondary | ICD-10-CM | POA: Diagnosis not present

## 2015-12-07 DIAGNOSIS — M722 Plantar fascial fibromatosis: Secondary | ICD-10-CM | POA: Diagnosis not present

## 2015-12-13 DIAGNOSIS — G43709 Chronic migraine without aura, not intractable, without status migrainosus: Secondary | ICD-10-CM | POA: Diagnosis not present

## 2015-12-30 ENCOUNTER — Telehealth: Payer: Self-pay | Admitting: Behavioral Health

## 2015-12-30 NOTE — Telephone Encounter (Signed)
The following gaps in care were assessed: Mammogram Colonoscopy Pap Flu  Attempted to reach patient and schedule for an annual physical with PCP per the above gaps. Left a message for the patient to return call when available.

## 2016-01-12 ENCOUNTER — Telehealth: Payer: Self-pay | Admitting: Family

## 2016-01-12 NOTE — Telephone Encounter (Signed)
LM for pt to call and schedule flu shot or update records. °

## 2016-02-14 NOTE — Telephone Encounter (Signed)
Health maintenance updated

## 2016-02-14 NOTE — Telephone Encounter (Signed)
LM for pt to call and schedule flu shot or update records and schedule CPE. 2nd msg - please mark as declined per mgmt.

## 2016-03-19 DIAGNOSIS — G43709 Chronic migraine without aura, not intractable, without status migrainosus: Secondary | ICD-10-CM | POA: Diagnosis not present

## 2016-04-28 ENCOUNTER — Encounter (HOSPITAL_BASED_OUTPATIENT_CLINIC_OR_DEPARTMENT_OTHER): Payer: Self-pay | Admitting: *Deleted

## 2016-04-28 ENCOUNTER — Emergency Department (HOSPITAL_BASED_OUTPATIENT_CLINIC_OR_DEPARTMENT_OTHER)
Admission: EM | Admit: 2016-04-28 | Discharge: 2016-04-29 | Disposition: A | Discharge status: Home / Self Care | Payer: Commercial Managed Care - PPO | Attending: Emergency Medicine | Admitting: Emergency Medicine

## 2016-04-28 DIAGNOSIS — Z79899 Other long term (current) drug therapy: Secondary | ICD-10-CM | POA: Insufficient documentation

## 2016-04-28 DIAGNOSIS — G43809 Other migraine, not intractable, without status migrainosus: Secondary | ICD-10-CM | POA: Insufficient documentation

## 2016-04-28 MED ORDER — DIHYDROERGOTAMINE MESYLATE 1 MG/ML IJ SOLN
1.0000 mg | Freq: Once | INTRAMUSCULAR | Status: AC
  Administered 2016-04-28: 1 mg via INTRAVENOUS
  Filled 2016-04-28: qty 1

## 2016-04-28 MED ORDER — DEXAMETHASONE SODIUM PHOSPHATE 10 MG/ML IJ SOLN
10.0000 mg | Freq: Once | INTRAMUSCULAR | Status: AC
  Administered 2016-04-29: 10 mg via INTRAVENOUS
  Filled 2016-04-28: qty 1

## 2016-04-28 MED ORDER — SODIUM CHLORIDE 0.9 % IV SOLN: 1000.0000 mL | Freq: Once | INTRAVENOUS | Status: DC

## 2016-04-28 MED ORDER — SODIUM CHLORIDE 0.9 % IV SOLN
1000.0000 mL | Freq: Once | INTRAVENOUS | Status: AC
  Administered 2016-04-28: 1000 mL via INTRAVENOUS

## 2016-04-28 MED ORDER — KETOROLAC TROMETHAMINE 30 MG/ML IJ SOLN
30.0000 mg | Freq: Once | INTRAMUSCULAR | Status: AC
  Administered 2016-04-28: 30 mg via INTRAVENOUS
  Filled 2016-04-28: qty 1

## 2016-04-28 MED ORDER — SODIUM CHLORIDE 0.9 % IV SOLN: INTRAVENOUS | Status: DC

## 2016-04-28 MED ORDER — METOCLOPRAMIDE HCL 5 MG/ML IJ SOLN
10.0000 mg | Freq: Once | INTRAMUSCULAR | Status: AC
  Administered 2016-04-28: 10 mg via INTRAVENOUS
  Filled 2016-04-28: qty 2

## 2016-04-28 MED ORDER — SODIUM CHLORIDE 0.9 % IV SOLN
1000.0000 mL | INTRAVENOUS | Status: DC
  Administered 2016-04-28 (×2): 1000 mL via INTRAVENOUS

## 2016-04-28 MED ORDER — DIPHENHYDRAMINE HCL 50 MG/ML IJ SOLN
25.0000 mg | Freq: Once | INTRAMUSCULAR | Status: AC
  Administered 2016-04-28: 25 mg via INTRAVENOUS
  Filled 2016-04-28: qty 1

## 2016-04-28 MED ORDER — ONDANSETRON HCL 4 MG/2ML IJ SOLN
4.0000 mg | Freq: Once | INTRAMUSCULAR | Status: AC | PRN
  Administered 2016-04-28: 4 mg via INTRAVENOUS
  Filled 2016-04-28: qty 2

## 2016-04-28 NOTE — ED Notes (Signed)
Mha since 1100 am with multiple episodes of vomiting. +photosensitivity

## 2016-04-28 NOTE — ED Provider Notes (Signed)
CSN: [id number]     Arrival date & time 04/28/16  2038 History  By signing my name below, I, Phillis HaggisGabriella Gaje, attest that this documentation has been prepared under the direction and in the presence of Arby BarretteMarcy Russell Engelstad, MD. Electronically Signed: Phillis HaggisGabriella Gaje, ED Scribe. 04/28/2016. 9:50 PM.   Chief Complaint  Patient presents with  . Migraine   The history is provided by the patient. No language interpreter was used.  HPI Comments: Tasha Cruz is a 55 y.o. Female with a hx of migraine who presents to the Emergency Department complaining of a right sided headache onset 10 hours ago. Pt reports associated chills,  photophobia, sound sensitivity, and 24 episodes of vomiting. She states that the pain and symptoms are typical of her migraines. Pt states that she has taken Imitrex at home with no relief. She denies hx of aneurysms or tumors, fever, sinus pain, sore throat, cough, congestion, or abdominal pain. No incoordination or focal weakness numbness or tingling. Patient does report history of severe migraines but has not required in-hospital treatment for a long time.  Past Medical History  Diagnosis Date  . Migraine   . Spinal cord injuries   . Rib fractures 2011   Past Surgical History  Procedure Laterality Date  . Appendectomy    . Knee surgery     Family History  Problem Relation Age of Onset  . Hypertension Mother     deceased at 6764  . Hepatitis Father     ? autoimmune  . Pancreatitis Sister   . Pernicious anemia Sister   . Ovarian cancer Sister     age 55   Social History  Substance Use Topics  . Smoking status: Never Smoker   . Smokeless tobacco: Never Used  . Alcohol Use: No   OB History    No data available     Review of Systems 10 Systems reviewed and all are negative for acute change except as noted in the HPI.  Allergies  Clonazepam; Codeine; Oxycontin; and Tetanus toxoids  Home Medications   Prior to Admission medications   Medication Sig Start  Date End Date Taking? Authorizing Provider  cyclobenzaprine (FLEXERIL) 10 MG tablet Take 1 tablet (10 mg total) by mouth 3 (three) times daily as needed for muscle spasms. 09/01/14  Yes Levert FeinsteinYijun Yan, MD  SUMAtriptan (IMITREX) 6 MG/0.5ML SOLN injection INJECT 0.5 MLS (6 MG TOTAL) INTO THE SKIN AS NEEDED FOR MIGRAINE OR HEADACHE. 12/03/15  Yes Levert FeinsteinYijun Yan, MD  topiramate (TOPAMAX) 200 MG tablet Take 1 tablet (200 mg total) by mouth 2 (two) times daily. 09/01/14  Yes Levert FeinsteinYijun Yan, MD  methylPREDNISolone (MEDROL DOSEPAK) 4 MG TBPK tablet Take as directed 10/12/15   Sandford CrazeMelissa O'Sullivan, NP  ondansetron (ZOFRAN ODT) 4 MG disintegrating tablet Take 1 tablet (4 mg total) by mouth every 8 (eight) hours as needed for nausea or vomiting. 04/29/16   Shon Batonourtney F Horton, MD   BP 106/64 mmHg  Pulse 78  Temp(Src) 97.6 F (36.4 C) (Oral)  Resp 18  Ht 5\' 4"  (1.626 m)  Wt 130 lb (58.968 kg)  BMI 22.30 kg/m2  SpO2 99% Physical Exam  Constitutional: She is oriented to person, place, and time. She appears well-developed and well-nourished.  Non-toxic appearance.  Appears to be in moderate to severe pain  HENT:  Head: Normocephalic and atraumatic.  Mouth/Throat: Oropharynx is clear and moist. No oropharyngeal exudate.  Eyes: Conjunctivae and EOM are normal. Pupils are equal, round, and reactive to light. No  scleral icterus.  Neck: Normal range of motion. Neck supple.  Cardiovascular: Normal rate and regular rhythm.   Pulmonary/Chest: Effort normal and breath sounds normal. No respiratory distress.  Abdominal: Soft. There is no tenderness.  Musculoskeletal: Normal range of motion. She exhibits no edema.  No peripheral edema  Neurological: She is alert and oriented to person, place, and time. She has normal reflexes. No cranial nerve deficit. She exhibits normal muscle tone. Coordination normal.  Negative SLR bilaterally; Movements purposeful and symmetric; 5/5 strength of lower extremities; sensation intact to light touch   Skin: Skin is warm.  Psychiatric: She has a normal mood and affect. Her behavior is normal.  Nursing note and vitals reviewed.   ED Course  Procedures (including critical care time) DIAGNOSTIC STUDIES: Oxygen Saturation is 100% on RA, normal by my interpretation.    COORDINATION OF CARE: 9:48 PM-Discussed treatment plan which includes IV fluids with pt at bedside and pt agreed to plan.   Labs Review Labs Reviewed - No data to display  Imaging Review No results found. I have personally reviewed and evaluated these images and lab results as part of my medical decision-making.   EKG Interpretation None     Recheck:00:05 patient continues to have headache and nausea after migraine therapy with Benadryl, Toradol, Reglan and 2 L fluid subsequent followed by DHE 1 mg IV. Patient's mental status is clear. No confusion. Vital signs stable. MDM   Final diagnoses:  Other migraine without status migrainosus, not intractable  Patient reports a history of severe migraines. Initial migraine therapy of Benadryl, Toradol Reglan and fluids did not have significant relief for the patient. DHE 1 mg once minister as well. Patient continued to have nausea and pain. I did review with her the potential for hospital admission for intractable migraine. Patient did not wish to be admitted to the hospital. The case was reviewed with Dr. Wilkie Aye who added a magnesium infusion as well. Final disposition will be pending the patient's response to therapy.   Arby Barrette, MD 05/04/16 2329

## 2016-04-28 NOTE — ED Notes (Signed)
Approx 5-10 min after receiving DHE, pt became nauseated. Dr. Donnald GarrePfeiffer advised. No further orders given. Will monitor pt to see if DHE alleviates s/s.

## 2016-04-29 DIAGNOSIS — G43809 Other migraine, not intractable, without status migrainosus: Secondary | ICD-10-CM | POA: Diagnosis not present

## 2016-04-29 MED ORDER — ONDANSETRON HCL 4 MG/2ML IJ SOLN
4.0000 mg | Freq: Once | INTRAMUSCULAR | Status: AC
  Administered 2016-04-29: 4 mg via INTRAVENOUS
  Filled 2016-04-29: qty 2

## 2016-04-29 MED ORDER — SODIUM CHLORIDE 0.9 % IV SOLN
INTRAVENOUS | Status: DC
  Administered 2016-04-29: 01:00:00 via INTRAVENOUS

## 2016-04-29 MED ORDER — MAGNESIUM SULFATE IN D5W 1-5 GM/100ML-% IV SOLN
1.0000 g | Freq: Once | INTRAVENOUS | Status: DC
  Filled 2016-04-29: qty 100

## 2016-04-29 MED ORDER — MAGNESIUM SULFATE 50 % IJ SOLN
INTRAMUSCULAR | Status: AC
  Administered 2016-04-29: 1 g
  Filled 2016-04-29: qty 2

## 2016-04-29 MED ORDER — ONDANSETRON 4 MG PO TBDP: 4.0000 mg | ORAL_TABLET | Freq: Three times a day (TID) | ORAL | Status: AC | PRN

## 2016-04-29 NOTE — ED Notes (Signed)
Pt given d/c insrtructionws as per chart. Verbalizes understanding. No questions. Rx x 1

## 2016-04-29 NOTE — ED Notes (Signed)
MD at bedside. 

## 2016-04-29 NOTE — Discharge Instructions (Signed)

## 2016-04-29 NOTE — ED Provider Notes (Signed)
SIgned out for reassessment following treatment.  1:56 AM Patient reports that she feels better after magnesium infusion. Current pain is 5 out of 10. She states "it's tolerable." She is requesting discharge home. She does report persistent nausea. She remains nontoxic focal. Will discharge home with Zofran at her request.  After history, exam, and medical workup I feel the patient has been appropriately medically screened and is safe for discharge home. Pertinent diagnoses were discussed with the patient. Patient was given return precautions.   Shon Batonourtney F Rollan Roger, MD 04/29/16 0157

## 2016-11-07 DIAGNOSIS — M791 Myalgia: Secondary | ICD-10-CM | POA: Diagnosis not present

## 2016-11-07 DIAGNOSIS — R51 Headache: Secondary | ICD-10-CM | POA: Diagnosis not present

## 2016-11-07 DIAGNOSIS — M542 Cervicalgia: Secondary | ICD-10-CM | POA: Diagnosis not present

## 2017-01-07 DIAGNOSIS — G43709 Chronic migraine without aura, not intractable, without status migrainosus: Secondary | ICD-10-CM | POA: Diagnosis not present

## 2017-02-25 DIAGNOSIS — M542 Cervicalgia: Secondary | ICD-10-CM | POA: Diagnosis not present

## 2017-02-25 DIAGNOSIS — M791 Myalgia: Secondary | ICD-10-CM | POA: Diagnosis not present

## 2017-02-25 DIAGNOSIS — R51 Headache: Secondary | ICD-10-CM | POA: Diagnosis not present

## 2017-04-09 DIAGNOSIS — G43709 Chronic migraine without aura, not intractable, without status migrainosus: Secondary | ICD-10-CM | POA: Diagnosis not present

## 2018-08-25 ENCOUNTER — Other Ambulatory Visit: Payer: Self-pay | Admitting: Chiropractic Medicine

## 2018-08-25 DIAGNOSIS — M25551 Pain in right hip: Secondary | ICD-10-CM

## 2018-08-29 ENCOUNTER — Other Ambulatory Visit: Payer: Self-pay | Admitting: Chiropractic Medicine

## 2018-08-29 DIAGNOSIS — M25551 Pain in right hip: Secondary | ICD-10-CM

## 2018-09-26 ENCOUNTER — Other Ambulatory Visit: Payer: Self-pay | Admitting: Chiropractic Medicine

## 2018-09-30 ENCOUNTER — Other Ambulatory Visit: Payer: Self-pay | Admitting: Chiropractic Medicine

## 2018-10-13 ENCOUNTER — Other Ambulatory Visit: Payer: Medicare Other
